# Patient Record
Sex: Male | Born: 1965 | Race: Black or African American | Hispanic: No | Marital: Single
Health system: Southern US, Community
[De-identification: ages and names within clinical notes are randomized; demographics above are authoritative.]

---

## 2020-05-11 ENCOUNTER — Inpatient Hospital Stay (HOSPITAL_COMMUNITY): Payer: Self-pay

## 2020-05-11 ENCOUNTER — Inpatient Hospital Stay (HOSPITAL_COMMUNITY)
Admission: EM | Admit: 2020-05-11 | Discharge: 2020-05-21 | DRG: 296 | Disposition: E | Payer: Self-pay | Attending: Pulmonary Disease | Admitting: Pulmonary Disease

## 2020-05-11 ENCOUNTER — Emergency Department (HOSPITAL_COMMUNITY): Payer: Self-pay

## 2020-05-11 DIAGNOSIS — R000 Tachycardia, unspecified: Secondary | ICD-10-CM | POA: Diagnosis present

## 2020-05-11 DIAGNOSIS — Z515 Encounter for palliative care: Secondary | ICD-10-CM

## 2020-05-11 DIAGNOSIS — E872 Acidosis: Secondary | ICD-10-CM | POA: Diagnosis present

## 2020-05-11 DIAGNOSIS — J9601 Acute respiratory failure with hypoxia: Secondary | ICD-10-CM | POA: Diagnosis present

## 2020-05-11 DIAGNOSIS — G931 Anoxic brain damage, not elsewhere classified: Secondary | ICD-10-CM | POA: Diagnosis present

## 2020-05-11 DIAGNOSIS — G40901 Epilepsy, unspecified, not intractable, with status epilepticus: Secondary | ICD-10-CM | POA: Diagnosis present

## 2020-05-11 DIAGNOSIS — R404 Transient alteration of awareness: Secondary | ICD-10-CM | POA: Diagnosis present

## 2020-05-11 DIAGNOSIS — J690 Pneumonitis due to inhalation of food and vomit: Secondary | ICD-10-CM | POA: Diagnosis present

## 2020-05-11 DIAGNOSIS — Z9911 Dependence on respirator [ventilator] status: Secondary | ICD-10-CM

## 2020-05-11 DIAGNOSIS — Z452 Encounter for adjustment and management of vascular access device: Secondary | ICD-10-CM

## 2020-05-11 DIAGNOSIS — I469 Cardiac arrest, cause unspecified: Secondary | ICD-10-CM

## 2020-05-11 DIAGNOSIS — G40409 Other generalized epilepsy and epileptic syndromes, not intractable, without status epilepticus: Secondary | ICD-10-CM

## 2020-05-11 DIAGNOSIS — Z66 Do not resuscitate: Secondary | ICD-10-CM | POA: Diagnosis not present

## 2020-05-11 DIAGNOSIS — G936 Cerebral edema: Secondary | ICD-10-CM | POA: Diagnosis present

## 2020-05-11 DIAGNOSIS — K7200 Acute and subacute hepatic failure without coma: Secondary | ICD-10-CM | POA: Diagnosis not present

## 2020-05-11 DIAGNOSIS — R7989 Other specified abnormal findings of blood chemistry: Secondary | ICD-10-CM | POA: Diagnosis present

## 2020-05-11 DIAGNOSIS — R4182 Altered mental status, unspecified: Secondary | ICD-10-CM

## 2020-05-11 DIAGNOSIS — Z20822 Contact with and (suspected) exposure to covid-19: Secondary | ICD-10-CM | POA: Diagnosis present

## 2020-05-11 DIAGNOSIS — R008 Other abnormalities of heart beat: Secondary | ICD-10-CM

## 2020-05-11 DIAGNOSIS — E876 Hypokalemia: Secondary | ICD-10-CM | POA: Diagnosis present

## 2020-05-11 DIAGNOSIS — G9341 Metabolic encephalopathy: Secondary | ICD-10-CM | POA: Diagnosis present

## 2020-05-11 LAB — BASIC METABOLIC PANEL
Anion gap: 11 (ref 5–15)
BUN: 9 mg/dL (ref 6–20)
CO2: 24 mmol/L (ref 22–32)
Calcium: 8.3 mg/dL — ABNORMAL LOW (ref 8.9–10.3)
Chloride: 103 mmol/L (ref 98–111)
Creatinine, Ser: 0.85 mg/dL (ref 0.61–1.24)
GFR, Estimated: >60 mL/min (ref >60)
Glucose, Bld: 98 mg/dL (ref 70–99)
Potassium: 3.6 mmol/L (ref 3.5–5.1)
Sodium: 138 mmol/L (ref 135–145)

## 2020-05-11 LAB — I-STAT ARTERIAL BLOOD GAS, ED
Acid-base deficit: 10 mmol/L — ABNORMAL HIGH (ref 0.0–2.0)
Bicarbonate: 17.3 mmol/L — ABNORMAL LOW (ref 20.0–28.0)
Calcium, Ion: 1.14 mmol/L — ABNORMAL LOW (ref 1.15–1.40)
HCT: 48 % (ref 39.0–52.0)
Hemoglobin: 16.3 g/dL (ref 13.0–17.0)
O2 Saturation: 96 %
Potassium: 3.3 mmol/L — ABNORMAL LOW (ref 3.5–5.1)
Sodium: 141 mmol/L (ref 135–145)
TCO2: 19 mmol/L — ABNORMAL LOW (ref 22–32)
pCO2 arterial: 43 mmHg (ref 32.0–48.0)
pH, Arterial: 7.212 — ABNORMAL LOW (ref 7.350–7.450)
pO2, Arterial: 102 mmHg (ref 83.0–108.0)

## 2020-05-11 LAB — ECHOCARDIOGRAM COMPLETE
Area-P 1/2: 7.44 cm2
Calc EF: 41.1 %
Height: 71 in
S' Lateral: 3.9 cm
Single Plane A2C EF: 45.5 %
Single Plane A4C EF: 34.5 %
Weight: 3880.1 oz

## 2020-05-11 LAB — ETHANOL: Alcohol, Ethyl (B): 141 mg/dL — ABNORMAL HIGH (ref <10)

## 2020-05-11 LAB — CBC
HCT: 47.2 % (ref 39.0–52.0)
Hemoglobin: 15.8 g/dL (ref 13.0–17.0)
MCH: 33.4 pg (ref 26.0–34.0)
MCHC: 33.5 g/dL (ref 30.0–36.0)
MCV: 99.8 fL (ref 80.0–100.0)
Platelets: 262 10*3/uL (ref 150–400)
RBC: 4.73 MIL/uL (ref 4.22–5.81)
RDW: 12.9 % (ref 11.5–15.5)
WBC: 13.1 10*3/uL — ABNORMAL HIGH (ref 4.0–10.5)
nRBC: 0 % (ref 0.0–0.2)

## 2020-05-11 LAB — URINALYSIS, ROUTINE W REFLEX MICROSCOPIC
Bilirubin Urine: NEGATIVE
Glucose, UA: >=500 mg/dL — AB
Ketones, ur: 5 mg/dL — AB
Leukocytes,Ua: NEGATIVE
Nitrite: NEGATIVE
Protein, ur: 100 mg/dL — AB
Specific Gravity, Urine: 1.013 (ref 1.005–1.030)
pH: 5 (ref 5.0–8.0)

## 2020-05-11 LAB — CBC WITH DIFFERENTIAL/PLATELET
Abs Immature Granulocytes: 0.4 10*3/uL — ABNORMAL HIGH (ref 0.00–0.07)
Basophils Absolute: 0.1 10*3/uL (ref 0.0–0.1)
Basophils Relative: 1 %
Eosinophils Absolute: 0 10*3/uL (ref 0.0–0.5)
Eosinophils Relative: 0 %
HCT: 47.1 % (ref 39.0–52.0)
Hemoglobin: 15.5 g/dL (ref 13.0–17.0)
Immature Granulocytes: 3 %
Lymphocytes Relative: 25 %
Lymphs Abs: 3 10*3/uL (ref 0.7–4.0)
MCH: 33.4 pg (ref 26.0–34.0)
MCHC: 32.9 g/dL (ref 30.0–36.0)
MCV: 101.5 fL — ABNORMAL HIGH (ref 80.0–100.0)
Monocytes Absolute: 0.7 10*3/uL (ref 0.1–1.0)
Monocytes Relative: 6 %
Neutro Abs: 7.8 10*3/uL — ABNORMAL HIGH (ref 1.7–7.7)
Neutrophils Relative %: 65 %
Platelets: 263 10*3/uL (ref 150–400)
RBC: 4.64 MIL/uL (ref 4.22–5.81)
RDW: 13 % (ref 11.5–15.5)
WBC: 11.9 10*3/uL — ABNORMAL HIGH (ref 4.0–10.5)
nRBC: 0 % (ref 0.0–0.2)

## 2020-05-11 LAB — COMPREHENSIVE METABOLIC PANEL
ALT: 80 U/L — ABNORMAL HIGH (ref 0–44)
AST: 86 U/L — ABNORMAL HIGH (ref 15–41)
Albumin: 3.7 g/dL (ref 3.5–5.0)
Alkaline Phosphatase: 70 U/L (ref 38–126)
Anion gap: 18 — ABNORMAL HIGH (ref 5–15)
BUN: 8 mg/dL (ref 6–20)
CO2: 15 mmol/L — ABNORMAL LOW (ref 22–32)
Calcium: 8 mg/dL — ABNORMAL LOW (ref 8.9–10.3)
Chloride: 105 mmol/L (ref 98–111)
Creatinine, Ser: 1.26 mg/dL — ABNORMAL HIGH (ref 0.61–1.24)
GFR, Estimated: >60 mL/min (ref >60)
Glucose, Bld: 285 mg/dL — ABNORMAL HIGH (ref 70–99)
Potassium: 3.4 mmol/L — ABNORMAL LOW (ref 3.5–5.1)
Sodium: 138 mmol/L (ref 135–145)
Total Bilirubin: 1 mg/dL (ref 0.3–1.2)
Total Protein: 6.6 g/dL (ref 6.5–8.1)

## 2020-05-11 LAB — GLUCOSE, CAPILLARY: Glucose-Capillary: 77 mg/dL (ref 70–99)

## 2020-05-11 LAB — RESP PANEL BY RT-PCR (FLU A&B, COVID) ARPGX2
Influenza A by PCR: NEGATIVE
Influenza B by PCR: NEGATIVE
SARS Coronavirus 2 by RT PCR: NEGATIVE

## 2020-05-11 LAB — RAPID URINE DRUG SCREEN, HOSP PERFORMED
Amphetamines: NOT DETECTED
Barbiturates: NOT DETECTED
Benzodiazepines: POSITIVE — AB
Cocaine: NOT DETECTED
Opiates: NOT DETECTED
Tetrahydrocannabinol: NOT DETECTED

## 2020-05-11 LAB — CREATININE, SERUM
Creatinine, Ser: 1.17 mg/dL (ref 0.61–1.24)
GFR, Estimated: >60 mL/min (ref >60)

## 2020-05-11 LAB — LACTIC ACID, PLASMA: Lactic Acid, Venous: 7.7 mmol/L (ref 0.5–1.9)

## 2020-05-11 LAB — PROTIME-INR
INR: 1.1 (ref 0.8–1.2)
Prothrombin Time: 14.3 seconds (ref 11.4–15.2)

## 2020-05-11 LAB — HEMOGLOBIN A1C
Hgb A1c MFr Bld: 4.6 % — ABNORMAL LOW (ref 4.8–5.6)
Mean Plasma Glucose: 85.32 mg/dL

## 2020-05-11 LAB — ACETAMINOPHEN LEVEL: Acetaminophen (Tylenol), Serum: <10 ug/mL — ABNORMAL LOW (ref 10–30)

## 2020-05-11 LAB — MRSA PCR SCREENING: MRSA by PCR: NEGATIVE

## 2020-05-11 LAB — HIV ANTIBODY (ROUTINE TESTING W REFLEX): HIV Screen 4th Generation wRfx: NONREACTIVE

## 2020-05-11 LAB — TROPONIN I (HIGH SENSITIVITY)
Troponin I (High Sensitivity): 13 ng/L (ref <18)
Troponin I (High Sensitivity): 99 ng/L — ABNORMAL HIGH (ref <18)

## 2020-05-11 LAB — LIPASE, BLOOD: Lipase: 33 U/L (ref 11–51)

## 2020-05-11 MED ORDER — HEPARIN SODIUM (PORCINE) 5000 UNIT/ML IJ SOLN: 5000.0000 [IU] | Freq: Three times a day (TID) | INTRAMUSCULAR | Status: DC

## 2020-05-11 MED ORDER — SODIUM CHLORIDE 0.9 % IV BOLUS: 500.0000 mL | Freq: Once | INTRAVENOUS | Status: DC

## 2020-05-11 MED ORDER — POLYETHYLENE GLYCOL 3350 17 G PO PACK: 17.0000 g | PACK | Freq: Every day | ORAL | Status: DC | PRN

## 2020-05-11 MED ORDER — LEVETIRACETAM IN NACL 1500 MG/100ML IV SOLN
1500.0000 mg | Freq: Two times a day (BID) | INTRAVENOUS | Status: DC
  Filled 2020-05-11: qty 100

## 2020-05-11 MED ORDER — POTASSIUM CHLORIDE 20 MEQ PO PACK
20.0000 meq | PACK | Freq: Once | ORAL | Status: AC
  Administered 2020-05-11: 20 meq
  Filled 2020-05-11: qty 1

## 2020-05-11 MED ORDER — CISATRACURIUM BESYLATE (PF) 200 MG/20ML IV SOLN
0.0000 ug/kg/min | INTRAVENOUS | Status: DC
  Administered 2020-05-11: 3 ug/kg/min via INTRAVENOUS
  Administered 2020-05-12: 2 ug/kg/min via INTRAVENOUS
  Filled 2020-05-11 (×3): qty 20

## 2020-05-11 MED ORDER — CEFTRIAXONE SODIUM 2 G IJ SOLR
2.0000 g | INTRAMUSCULAR | Status: DC
Start: 2020-05-11 — End: 2020-05-15
  Administered 2020-05-11 – 2020-05-14 (×4): 2 g via INTRAVENOUS
  Filled 2020-05-11 (×2): qty 20
  Filled 2020-05-11 (×2): qty 2
  Filled 2020-05-11 (×2): qty 20

## 2020-05-11 MED ORDER — INSULIN ASPART 100 UNIT/ML ~~LOC~~ SOLN
0.0000 [IU] | SUBCUTANEOUS | Status: DC
  Administered 2020-05-15: 2 [IU] via SUBCUTANEOUS

## 2020-05-11 MED ORDER — SODIUM CHLORIDE 0.9 % IV SOLN
250.0000 mL | INTRAVENOUS | Status: DC
  Administered 2020-05-11 – 2020-05-12 (×2): 250 mL via INTRAVENOUS

## 2020-05-11 MED ORDER — FENTANYL CITRATE (PF) 100 MCG/2ML IJ SOLN: 50.0000 ug | INTRAMUSCULAR | Status: DC | PRN

## 2020-05-11 MED ORDER — FENTANYL CITRATE (PF) 100 MCG/2ML IJ SOLN
50.0000 ug | INTRAMUSCULAR | Status: DC | PRN
  Administered 2020-05-11: 50 ug via INTRAVENOUS

## 2020-05-11 MED ORDER — SODIUM CHLORIDE 0.9 % IV SOLN
4000.0000 mg | INTRAVENOUS | Status: AC
  Administered 2020-05-11: 4000 mg via INTRAVENOUS
  Filled 2020-05-11: qty 40

## 2020-05-11 MED ORDER — MIDAZOLAM 50MG/50ML (1MG/ML) PREMIX INFUSION
0.5000 mg/h | INTRAVENOUS | Status: DC
  Administered 2020-05-11: 0.5 mg/h via INTRAVENOUS
  Filled 2020-05-11: qty 50

## 2020-05-11 MED ORDER — CHLORHEXIDINE GLUCONATE CLOTH 2 % EX PADS
6.0000 | MEDICATED_PAD | Freq: Every day | CUTANEOUS | Status: DC
  Administered 2020-05-12 – 2020-05-15 (×4): 6 via TOPICAL

## 2020-05-11 MED ORDER — LEVETIRACETAM IN NACL 1000 MG/100ML IV SOLN
1000.0000 mg | Freq: Two times a day (BID) | INTRAVENOUS | Status: DC
  Administered 2020-05-12 – 2020-05-15 (×8): 1000 mg via INTRAVENOUS
  Filled 2020-05-11 (×8): qty 100

## 2020-05-11 MED ORDER — PROPOFOL 1000 MG/100ML IV EMUL
0.0000 ug/kg/min | INTRAVENOUS | Status: DC
  Administered 2020-05-11: 50 ug/kg/min via INTRAVENOUS
  Administered 2020-05-11: 25 ug/kg/min via INTRAVENOUS
  Administered 2020-05-12: 20 ug/kg/min via INTRAVENOUS
  Administered 2020-05-12 (×3): 30 ug/kg/min via INTRAVENOUS
  Administered 2020-05-13 – 2020-05-14 (×7): 25 ug/kg/min via INTRAVENOUS
  Administered 2020-05-14: 5 ug/kg/min via INTRAVENOUS
  Administered 2020-05-14: 25 ug/kg/min via INTRAVENOUS
  Filled 2020-05-11 (×2): qty 100
  Filled 2020-05-11: qty 200
  Filled 2020-05-11 (×6): qty 100
  Filled 2020-05-11: qty 200
  Filled 2020-05-11 (×4): qty 100

## 2020-05-11 MED ORDER — MIDAZOLAM 50MG/50ML (1MG/ML) PREMIX INFUSION
0.5000 mg/h | INTRAVENOUS | Status: AC
  Administered 2020-05-11: 5 mg/h via INTRAVENOUS
  Filled 2020-05-11: qty 50

## 2020-05-11 MED ORDER — ROCURONIUM BROMIDE 10 MG/ML (PF) SYRINGE
PREFILLED_SYRINGE | INTRAVENOUS | Status: AC
  Filled 2020-05-11: qty 10

## 2020-05-11 MED ORDER — DOCUSATE SODIUM 50 MG/5ML PO LIQD: 100.0000 mg | Freq: Two times a day (BID) | ORAL | Status: DC | PRN

## 2020-05-11 MED ORDER — NOREPINEPHRINE 4 MG/250ML-% IV SOLN
INTRAVENOUS | Status: AC
  Filled 2020-05-11: qty 250

## 2020-05-11 MED ORDER — HEPARIN SODIUM (PORCINE) 5000 UNIT/ML IJ SOLN
5000.0000 [IU] | Freq: Three times a day (TID) | INTRAMUSCULAR | Status: DC
  Administered 2020-05-11 – 2020-05-15 (×12): 5000 [IU] via SUBCUTANEOUS
  Filled 2020-05-11 (×12): qty 1

## 2020-05-11 MED ORDER — ROCURONIUM BROMIDE 50 MG/5ML IV SOLN
80.0000 mg | Freq: Once | INTRAVENOUS | Status: AC
  Administered 2020-05-11: 80 mg via INTRAVENOUS
  Filled 2020-05-11: qty 8

## 2020-05-11 MED ORDER — FENTANYL 2500MCG IN NS 250ML (10MCG/ML) PREMIX INFUSION
50.0000 ug/h | INTRAVENOUS | Status: DC
  Administered 2020-05-11: 50 ug/h via INTRAVENOUS
  Administered 2020-05-12: 200 ug/h via INTRAVENOUS
  Administered 2020-05-13: 150 ug/h via INTRAVENOUS
  Administered 2020-05-14: 100 ug/h via INTRAVENOUS
  Filled 2020-05-11 (×5): qty 250

## 2020-05-11 MED ORDER — FENTANYL BOLUS VIA INFUSION
50.0000 ug | INTRAVENOUS | Status: DC | PRN
  Filled 2020-05-11: qty 50

## 2020-05-11 MED ORDER — CISATRACURIUM BOLUS VIA INFUSION
10.0000 mg | Freq: Once | INTRAVENOUS | Status: AC
  Administered 2020-05-11: 10 mg via INTRAVENOUS
  Filled 2020-05-11: qty 10

## 2020-05-11 MED ORDER — FENTANYL CITRATE (PF) 100 MCG/2ML IJ SOLN
50.0000 ug | Freq: Once | INTRAMUSCULAR | Status: AC
  Administered 2020-05-11: 50 ug via INTRAVENOUS

## 2020-05-11 MED ORDER — STERILE WATER FOR INJECTION IV SOLN
INTRAVENOUS | Status: DC
  Filled 2020-05-11 (×4): qty 1000

## 2020-05-11 MED ORDER — SODIUM CHLORIDE 0.9 % IV SOLN
0.5000 mg/h | INTRAVENOUS | Status: DC
  Administered 2020-05-11 – 2020-05-12 (×2): 20 mg/h via INTRAVENOUS
  Administered 2020-05-12: 60 mg/h via INTRAVENOUS
  Administered 2020-05-12: 80 mg/h via INTRAVENOUS
  Administered 2020-05-13: 55 mg/h via INTRAVENOUS
  Administered 2020-05-13: 45 mg/h via INTRAVENOUS
  Administered 2020-05-13: 30 mg/h via INTRAVENOUS
  Administered 2020-05-14: 5 mg/h via INTRAVENOUS
  Filled 2020-05-11 (×11): qty 50

## 2020-05-11 MED ORDER — NOREPINEPHRINE 4 MG/250ML-% IV SOLN
2.0000 ug/min | INTRAVENOUS | Status: DC
  Administered 2020-05-12: 2 ug/min via INTRAVENOUS
  Filled 2020-05-11: qty 250

## 2020-05-11 MED ORDER — SODIUM CHLORIDE 0.9 % IV SOLN: INTRAVENOUS | Status: DC

## 2020-05-11 MED ORDER — PANTOPRAZOLE SODIUM 40 MG PO PACK
40.0000 mg | PACK | Freq: Every day | ORAL | Status: DC
  Administered 2020-05-11 – 2020-05-15 (×5): 40 mg
  Filled 2020-05-11 (×5): qty 20

## 2020-05-11 MED ORDER — LABETALOL HCL 5 MG/ML IV SOLN: 10.0000 mg | INTRAVENOUS | Status: DC | PRN

## 2020-05-11 MED ORDER — ORAL CARE MOUTH RINSE
15.0000 mL | OROMUCOSAL | Status: DC
  Administered 2020-05-11 – 2020-05-15 (×36): 15 mL via OROMUCOSAL

## 2020-05-11 MED ORDER — DOCUSATE SODIUM 100 MG PO CAPS: 100.0000 mg | ORAL_CAPSULE | Freq: Two times a day (BID) | ORAL | Status: DC | PRN

## 2020-05-11 MED ORDER — ARTIFICIAL TEARS OPHTHALMIC OINT
1.0000 "application " | TOPICAL_OINTMENT | Freq: Three times a day (TID) | OPHTHALMIC | Status: DC
  Administered 2020-05-11 – 2020-05-15 (×12): 1 via OPHTHALMIC
  Filled 2020-05-11 (×2): qty 3.5

## 2020-05-11 MED ORDER — PROPOFOL 1000 MG/100ML IV EMUL
INTRAVENOUS | Status: AC
  Administered 2020-05-11: 5 ug/kg/min via INTRAVENOUS
  Filled 2020-05-11: qty 100

## 2020-05-11 MED ORDER — CHLORHEXIDINE GLUCONATE 0.12% ORAL RINSE (MEDLINE KIT)
15.0000 mL | Freq: Two times a day (BID) | OROMUCOSAL | Status: DC
  Administered 2020-05-11 – 2020-05-15 (×9): 15 mL via OROMUCOSAL

## 2020-05-11 MED ORDER — SODIUM CHLORIDE 0.9 % IV SOLN
2.0000 g | INTRAVENOUS | Status: DC
  Filled 2020-05-11: qty 20

## 2020-05-11 NOTE — ED Triage Notes (Signed)
Found unresponsive in hotel room, pulseless and apneic. CPR initiated, given 1 epi. Regained ROSC. EKG unremarkable. Given 2mg  narcan with improvement in respiratory status. 8.0 ETT. 16g LFA. CPR initiated 0650, ROSC at 0700. Given 2.5mg  midazolam en route for agitation.

## 2020-05-11 NOTE — ED Notes (Addendum)
Pt's boxers, red shoes, chapstick, pants, ID, keys, black jacket, headphones, credit cards x2 placed in patient belonging bag.

## 2020-05-11 NOTE — Progress Notes (Signed)
LTM EEG hooked up and running - no initial skin breakdown - push button tested - neuro notified.  

## 2020-05-11 NOTE — H&P (Signed)
NAME:  Frank Payne, MRN:  [id number], DOB:  Jul 11, 1965, LOS: 0 ADMISSION DATE:  May 21, 2020, CONSULTATION DATE:  2020/05/21 REFERRING MD:  Deretha Emory, CHIEF COMPLAINT:  Cardiac arrest   History of Present Illness:  55 year old man with unk PMH presenting with cardiac arrest. OOH, unwitnessed, unk downtime, PEA initial rhythm CPR x 10 mins before ROSC Given sedation en route for question agitation Question if narcan helped Now in myoclonus Labs are pending CT head is pending PCCM asked to admit  Pertinent  Medical History  Unknown  Significant Hospital Events: Including procedures, antibiotic start and stop dates in addition to other pertinent events   . 4/21 admitted  Interim History / Subjective:  Consulted  Objective   Blood pressure 111/70, pulse (!) 110, resp. rate (!) 22, height 5\' 11"  (1.803 m), SpO2 100 %.    Vent Mode: PRVC FiO2 (%):  [40 %] 40 % Set Rate:  [18 bmp] 18 bmp Vt Set:  [600 mL] 600 mL PEEP:  [5 cmH20] 5 cmH20 Plateau Pressure:  [14 cmH20] 14 cmH20  No intake or output data in the 24 hours ending May 21, 2020 0912 There were no vitals filed for this visit.  Examination: Constitutional: ill appearing comatose man in myoclonus  Eyes: pupils not reacting, twitching of eyelids does not allow good corneal reflex examt Ears, nose, mouth, and throat: ETT in place, minimal secretions Cardiovascular: tachycardic, ext warm Respiratory: scattered rhonci, triggering vent Gastrointestinal: soft, hypoactive bS Skin: No rashes, normal turgor Neurologic: myoclonus worse on left Psychiatric: cannot assess    Labs/imaging that I havepersonally reviewed  (right click and "Reselect all SmartList Selections" daily)   ABG mixed resp/metabolic acidosis CBC mild leukocytosis EKG no STEMI Head CT pending CXR L aspiration likely UDS pending  Resolved Hospital Problem list   n/a  Assessment & Plan:  OOH PEA Arrest suspect prolonged downtime.  EKG benign.  Would  like to see UDS. Anoxic myoclonus and encephalopathy Acute hypoxemic respiratory failure with likely aspiration pneumonitis  - CT Head, if no bleed proceed with TTM 36C, continuous EEG etc - f/u CMP, UDS - Check lactate/trop - Prop/versed to help with myoclonus - Ceftriaxone x 5 days for aspiration, vent support with VAP prevention bundle - Need to find family, do not think this is going to go well  Best practice (right click and "Reselect all SmartList Selections" daily)  Diet:  NPO Pain/Anxiety/Delirium protocol (if indicated): Yes (RASS goal -4) VAP protocol (if indicated): Yes DVT prophylaxis: SCD GI prophylaxis: PPI Glucose control:  SSI Yes Central venous access:  Yes, and it is still needed Arterial line:  N/A Foley:  Yes, and it is still needed Mobility:  bed rest  PT consulted: N/A Last date of multidisciplinary goals of care discussion [pending] Code Status:  full code Disposition: ICU  Labs   CBC: Recent Labs  Lab 21-May-2020 0825 05/21/2020 0848  WBC 11.9*  --   NEUTROABS 7.8*  --   HGB 15.5 16.3  HCT 47.1 48.0  MCV 101.5*  --   PLT 263  --     Basic Metabolic Panel: Recent Labs  Lab May 21, 2020 0848  NA 141  K 3.3*   GFR: CrCl cannot be calculated (No successful lab value found.). Recent Labs  Lab May 21, 2020 0825  WBC 11.9*    Liver Function Tests: No results for input(s): AST, ALT, ALKPHOS, BILITOT, PROT, ALBUMIN in the last 168 hours. No results for input(s): LIPASE, AMYLASE in the last 168 hours.  No results for input(s): AMMONIA in the last 168 hours.  ABG    Component Value Date/Time   PHART 7.212 (L) 06-04-20 0848   PCO2ART 43.0 06-04-20 0848   PO2ART 102 06-04-2020 0848   HCO3 17.3 (L) 2020-06-04 0848   TCO2 19 (L) 2020/06/04 0848   ACIDBASEDEF 10.0 (H) 2020/06/04 0848   O2SAT 96.0 06/04/20 0848     Coagulation Profile: No results for input(s): INR, PROTIME in the last 168 hours.  Cardiac Enzymes: No results for input(s):  CKTOTAL, CKMB, CKMBINDEX, TROPONINI in the last 168 hours.  HbA1C: No results found for: HGBA1C  CBG: No results for input(s): GLUCAP in the last 168 hours.  Review of Systems:   comatose  Past Medical History:  Unk  Surgical History:  Unk  Social History:  Unk  Family History:  Unk  Allergies Not on File   Home Medications  Prior to Admission medications   Not on File     Critical care time: 42 minutes not including any separately billable procedures

## 2020-05-11 NOTE — ED Provider Notes (Signed)
[facility] EMERGENCY DEPARTMENT Provider Note   CSN: [id number] Arrival date & time: 06/05/20  8891     History Chief Complaint  Patient presents with  . Cardiac Arrest    Frank Payne is a 55 y.o. male.  EMS arrived at about [phone] for patient unresponsive hotel room.  There was a woman in the room but said she did not know any of the circumstances.  Patient was pulseless apneic did not get specific rhythm from EMS.  They started CPR.  Patient received 1 mg of epinephrine and received 2 mg of Narcan.  Started breathing on his own.  CPR lasted at most 10 minutes more likely 6 minutes.  Patient was given some Versed because he was chewing on the endotracheal tube.  Paramedic did intubate him with an 8-0 tube.  Patient then remained with normal vital signs.  Slightly tachycardic blood pressures were normal.  Patient had an IO in his left shoulder and an IV in his left hand.  Long transport time.  Patient arrived at around 0 800.        No past medical history on file.  There are no problems to display for this patient.      No family history on file.     Home Medications Prior to Admission medications   Not on File    Allergies    Patient has no allergy information on record.  Review of Systems   Review of Systems  Unable to perform ROS: Patient unresponsive    Physical Exam Updated Vital Signs BP 111/70   Pulse (!) 110   Resp (!) 22   Ht 1.803 m (5\' 11" )   SpO2 100%   Physical Exam Vitals and nursing note reviewed.  Constitutional:      General: He is in acute distress.     Appearance: He is well-developed.     Comments: Patient arrived intubated.  HENT:     Head: Normocephalic and atraumatic.     Mouth/Throat:     Comments: 8 oh endotracheal tube through oropharynx Eyes:     Conjunctiva/sclera: Conjunctivae normal.     Comments: Pupils pinpoint.  Corneal reflex present  Cardiovascular:     Rate and Rhythm: Regular rhythm.  Tachycardia present.     Heart sounds: No murmur heard.   Pulmonary:     Effort: Respiratory distress present.     Breath sounds: Normal breath sounds. No wheezing or rales.     Comments: Patient breathing on his own. Abdominal:     Palpations: Abdomen is soft.     Tenderness: There is no abdominal tenderness.  Genitourinary:    Comments: Condom on penis Musculoskeletal:        General: No swelling.     Cervical back: Neck supple.     Comments: Radial and dorsalis pedis pulse strong 2+.  Skin:    General: Skin is warm and dry.     Capillary Refill: Capillary refill takes less than 2 seconds.  Neurological:     General: No focal deficit present.     Mental Status: He is oriented to person, place, and time.     Cranial Nerves: No cranial nerve deficit.     Sensory: No sensory deficit.     Motor: No weakness.     Comments: Patient breathing on his own.  Nonpurposeful movement of the left arm.  Corneal reflex present.     ED Results / Procedures / Treatments  Labs (all labs ordered are listed, but only abnormal results are displayed) Labs Reviewed  CBC WITH DIFFERENTIAL/PLATELET - Abnormal; Notable for the following components:      Result Value   WBC 11.9 (*)    MCV 101.5 (*)    Neutro Abs 7.8 (*)    Abs Immature Granulocytes 0.40 (*)    All other components within normal limits  I-STAT ARTERIAL BLOOD GAS, ED - Abnormal; Notable for the following components:   pH, Arterial 7.212 (*)    Bicarbonate 17.3 (*)    TCO2 19 (*)    Acid-base deficit 10.0 (*)    Potassium 3.3 (*)    Calcium, Ion 1.14 (*)    All other components within normal limits  RESP PANEL BY RT-PCR (FLU A&B, COVID) ARPGX2  URINALYSIS, ROUTINE W REFLEX MICROSCOPIC  ETHANOL  RAPID URINE DRUG SCREEN, HOSP PERFORMED  COMPREHENSIVE METABOLIC PANEL  LIPASE, BLOOD  ACETAMINOPHEN LEVEL  TROPONIN I (HIGH SENSITIVITY)    EKG EKG Interpretation  Date/Time:  Thursday May 11 2020 08:14:09 EDT Ventricular  Rate:  114 PR Interval:  157 QRS Duration: 88 QT Interval:  314 QTC Calculation: 433 R Axis:   23 Text Interpretation: Sinus tachycardia Ventricular premature complex Aberrant complex Confirmed by Vanetta Mulders [id number]) on 05/08/2020 8:28:25 AM   Radiology DG Chest Port 1 View  Result Date: 05/10/2020 CLINICAL DATA:  Status post cardiac arrest. Intubated. EXAM: PORTABLE CHEST 1 VIEW COMPARISON:  Thirteen 2021 FINDINGS: Endotracheal tube in satisfactory position. Stable enlarged cardiac silhouette. Interval mild patchy opacity in the left lung, most pronounced in the perihilar region. Clear right lung. Normal vascularity. Unremarkable bones. IMPRESSION: 1. Interval mild left lung patchy atelectasis, pneumonia or pulmonary contusion. 2. Stable cardiomegaly. 3. Endotracheal tube in satisfactory position. Electronically Signed   By: Beckie Salts M.D.   On: 05/20/2020 08:44    Procedures Procedures   CRITICAL CARE Performed by: Vanetta Mulders Total critical care time: 35 minutes Critical care time was exclusive of separately billable procedures and treating other patients. Critical care was necessary to treat or prevent imminent or life-threatening deterioration. Critical care was time spent personally by me on the following activities: development of treatment plan with patient and/or surrogate as well as nursing, discussions with consultants, evaluation of patient's response to treatment, examination of patient, obtaining history from patient or surrogate, ordering and performing treatments and interventions, ordering and review of laboratory studies, ordering and review of radiographic studies, pulse oximetry and re-evaluation of patient's condition.   Medications Ordered in ED Medications  midazolam (VERSED) 50 mg/50 mL (1 mg/mL) premix infusion (1.5 mg/hr Intravenous Rate/Dose Change 05/09/2020 0841)  0.9 %  sodium chloride infusion ( Intravenous New Bag/Given 04/23/2020 1062)    ED  Course  I have reviewed the triage vital signs and the nursing notes.  Pertinent labs & imaging results that were available during my care of the patient were reviewed by me and considered in my medical decision making (see chart for details).    MDM Rules/Calculators/A&P                          Patient upon arrival was intubated.  Breathing on his own.  Heart rate in the low 100s.  Blood pressure systolic 100 or better.  Patient had corneal reflex.  Patient went on to have some nonpurposeful movement of the left upper extremity.  Good breath sounds bilaterally patient was put on ventilator.  Chest x-ray  shows good endotracheal tube positioning.  Raises concerns for left lung patchy atelectasis pneumonia or pulmonary contusion.  Patient did have CPR.  Blood gas showed significant acidosis.  But currently PCO2 43 and PO2 102.  Suggestive of perhaps a long downtime.  Mild leukocytosis with a white blood cell count of 11.9.  Hemoglobin was 15.5.  Electrolytes are still pending.  Head CT has been ordered.  COVID testing ordered  Discussed with critical care.  They will see patient for admission.  SPECT a long downtime.  May be with some anoxic brain injury.  Patient was started on a Versed drip by [place].  Because he was clamping down on the endotracheal tube.    Final Clinical Impression(s) / ED Diagnoses Final diagnoses:  Cardiopulmonary arrest [facility])    Rx / DC Orders ED Discharge Orders    None       Vanetta Mulders, MD May 24, 2020 [phone]

## 2020-05-11 NOTE — Progress Notes (Addendum)
NOK called over camera phone. Discussed current anoxic status epilepticus, circumstances surrounding arrest. We agreed to continue current level of care but if Frank Payne takes a turn we will allow him to pass in peace. Should he not wake up despite TTM and seizure suppression in a few days, will revisit GOC.  18 minutes advance care discussions.  Myrla Halsted MD PCCM

## 2020-05-11 NOTE — Progress Notes (Signed)
Was able to get in contact with patients niece Rocco Serene.  She has been updated on patients condition and questions answered.  She will be attempting to contact patients family.

## 2020-05-11 NOTE — Consult Note (Addendum)
Neurology Consultation Reason for Consult: Myoclonic seizures Referring Physician: Dr. Levon Hedger  CC: Cardiac arrest  History is obtained from: Chart review as patient is intubated/sedated  HPI: Frank Payne is a 55 y.o. male who was brought in after an out-of-hospital cardiac arrest with unknown downtime.  CPR was performed for about 10 minutes before ROSC.  Was then noted to have axial jerking.  Stat EEG showed myoclonic seizures.  Therefore neurology was consulted.  ROS: Unable to obtain due to altered mental status.   Past medical history, surgical history, family history, social history: Unable to obtain due to altered mental status  Medications Prior to Admission  Medication Sig Dispense Refill Last Dose  . penicillin v potassium (VEETID) 500 MG tablet Take 500 mg by mouth 3 (three) times daily.         Exam: Current vital signs: BP 106/77   Pulse (!) 109   Temp (!) 97.34 F (36.3 C)   Resp (!) 9   Ht 5\' 11"  (1.803 m)   Wt 110 kg   SpO2 97%   BMI 33.82 kg/m  Vital signs in last 24 hours: Temp:  [89.6 F (32 C)-97.34 F (36.3 C)] 97.34 F (36.3 C) (04/21 1245) Pulse Rate:  [101-113] 109 (04/21 1245) Resp:  [9-26] 9 (04/21 1245) BP: (106-127)/(69-80) 106/77 (04/21 1245) SpO2:  [96 %-100 %] 97 % (04/21 1245) FiO2 (%):  [40 %] 40 % (04/21 1100) Weight:  [110 kg] 110 kg (04/21 0917)   Physical Exam  Constitutional: Appears well-developed and well-nourished.  Psych: Unable to assess due to altered mental status Eyes: No scleral injection HENT: No OP obstrucion Head: Normocephalic.  Cardiovascular: Tachycardic, regular rhythm Respiratory: Intubated, coarse breath sounds bilaterally GI: Soft.  No distension.  Skin: Warm Neuro: Comatose, does not open eyes to noxious stimuli, corneal reflex absent, gag reflex absent, decerebrate posturing in bilateral upper extremities, withdrawal in bilateral lower extremities noxious stimulation, had intermittent myoclonic  jerks  I have reviewed labs in epic and the results pertinent to this consultation are: CBC:  Recent Labs  Lab 05/02/2020 0825 05/17/2020 0848 04/28/2020 1135  WBC 11.9*  --  13.1*  NEUTROABS 7.8*  --   --   HGB 15.5 16.3 15.8  HCT 47.1 48.0 47.2  MCV 101.5*  --  99.8  PLT 263  --  262    Basic Metabolic Panel:  Lab Results  Component Value Date   NA 141 04/30/2020   K 3.3 (L) 05/03/2020   CO2 15 (L) 05/02/2020   GLUCOSE 285 (H) 05/09/2020   BUN 8 04/29/2020   CREATININE 1.17 04/21/2020   CALCIUM 8.0 (L) 05/01/2020   GFRNONAA >60 05/04/2020   Lipid Panel: No results found for: LDLCALC HgbA1c: No results found for: HGBA1C Urine Drug Screen:     Component Value Date/Time   LABOPIA NONE DETECTED 04/25/2020 1019   COCAINSCRNUR NONE DETECTED 05/14/2020 1019   LABBENZ POSITIVE (A) 05/10/2020 1019   AMPHETMU NONE DETECTED 04/24/2020 1019   THCU NONE DETECTED 05/17/2020 1019   LABBARB NONE DETECTED 05/05/2020 1019    Alcohol Level     Component Value Date/Time   ETH 141 (H) 05/17/2020 0825     I have reviewed the images obtained:  CT Head without contrast 05/08/2020: No acute abnormality  ASSESSMENT/PLAN: 55 year old male status post cardiac arrest, noted to have myoclonic seizures on arrival.  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  Family made him DNR and would like to continue with current treatment.  However if patient remains comatose, they would likely consider transition to comfort care.  Thank you for allowing 05/16/2020 to participate in the care of this patient. If you have any further questions, please contact  me or neurohospitalist.    CRITICAL CARE Performed by: [place]   Total critical care time: 35 minutes  Critical care time was exclusive of separately billable procedures and treating other patients.  Critical care was necessary to treat or prevent imminent or life-threatening deterioration.  Critical care was time spent personally by me on the following activities: development of treatment plan with patient and/or surrogate as well as nursing, discussions with consultants, evaluation of patient's response to treatment, examination of patient, obtaining history from patient or surrogate, ordering and performing treatments and interventions, ordering and review of laboratory studies, ordering and review of radiographic studies, pulse oximetry and re-evaluation of patient's condition.   Charlsie Quest [facility] neurohospitalist

## 2020-05-11 NOTE — Progress Notes (Signed)
  Echocardiogram 2D Echocardiogram has been performed.  Janalyn Harder May 13, 2020, 3:23 PM

## 2020-05-11 NOTE — Procedures (Signed)
Patient Name: Frank Payne  MRN: [id number]  Epilepsy Attending: Charlsie Quest  Referring Physician/Provider: Raymon Mutton, NP Date: 05/25/20  Duration: 26.50 mins  Patient history: 55 year old male status post cardiac arrest.  EEG to evaluate for seizures.  Level of alertness: comatose  AEDs during EEG study: Propofol, Versed  Technical aspects: This EEG study was done with scalp electrodes positioned according to the 10-20 International system of electrode placement. Electrical activity was acquired at a sampling rate of 500Hz  and reviewed with a high frequency filter of 70Hz  and a low frequency filter of 1Hz . EEG data were recorded continuously and digitally stored.   Description: Patient was noted to have brief generalized whole body jerking every 4 to 8 seconds on average. Concomitant EEG showed generalized polyspikes consistent with myoclonic seizures.  EEG also showed continuous generalized background suppression in between seizures.  EEG was not reactive to tactile stimulation. Hyperventilation and photic stimulation were not performed.     ABNORMALITY -Myoclonic seizures, generalized -Background suppression, generalized  IMPRESSION: This study showed myoclonic seizures every 4 to 8 seconds as well as profound diffuse encephalopathy.  In the setting of cardiac arrest, this is likely suggestive of anoxic/hypoxic brain injury.  Dr. was notified.  Moises Terpstra 

## 2020-05-11 NOTE — Progress Notes (Signed)
EEG complete - results pending 

## 2020-05-11 NOTE — Procedures (Signed)
.  Central Venous Catheter Insertion Procedure Note  Frank Payne  [id number]  09-30-1965  Date:04/25/2020  Time:12:39 PM   Provider Performing:Francyne Arreaga Carmon Ginsberg Earlene Plater   Procedure: Insertion of Non-tunneled Central Venous Catheter(36556) with US guidance (02542)     Indication(s) Medication administration and Difficult access  Consent Unable to obtain consent due to emergent nature of procedure.  Anesthesia Topical only with 1% lidocaine   Timeout Verified patient identification, verified procedure, site/side was marked, verified correct patient position, special equipment/implants available, medications/allergies/relevant history reviewed, required imaging and test results available.  Sterile Technique Maximal sterile technique including full sterile barrier drape, hand hygiene, sterile gown, sterile gloves, mask, hair covering, sterile ultrasound probe cover (if used).  Procedure Description Area of catheter insertion was cleaned with chlorhexidine and draped in sterile fashion.  With real-time ultrasound guidance a central venous catheter was placed into the left internal jugular vein. Nonpulsatile blood flow and easy flushing noted in all ports.  The catheter was sutured in place and sterile dressing applied.  Complications/Tolerance None; patient tolerated the procedure well. Chest X-ray is ordered to verify placement for internal jugular or subclavian cannulation.   Chest x-ray is not ordered for femoral cannulation.  EBL Minimal  Specimen(s) None   Delfin Gant, NP-C [facility] Personal contact information can be found on Amion  05/06/2020, 12:40 PM

## 2020-05-11 NOTE — ED Notes (Signed)
I was unable to get pt to sign MSE due to being unconscious and no family was present.

## 2020-05-12 LAB — TRIGLYCERIDES: Triglycerides: 220 mg/dL — ABNORMAL HIGH (ref <150)

## 2020-05-12 LAB — GLUCOSE, CAPILLARY
Glucose-Capillary: 57 mg/dL — ABNORMAL LOW (ref 70–99)
Glucose-Capillary: 69 mg/dL — ABNORMAL LOW (ref 70–99)
Glucose-Capillary: 78 mg/dL (ref 70–99)
Glucose-Capillary: 78 mg/dL (ref 70–99)
Glucose-Capillary: 79 mg/dL (ref 70–99)
Glucose-Capillary: 80 mg/dL (ref 70–99)
Glucose-Capillary: 84 mg/dL (ref 70–99)
Glucose-Capillary: 91 mg/dL (ref 70–99)
Glucose-Capillary: 93 mg/dL (ref 70–99)
Glucose-Capillary: 96 mg/dL (ref 70–99)

## 2020-05-12 LAB — PHOSPHORUS
Phosphorus: 1.1 mg/dL — ABNORMAL LOW (ref 2.5–4.6)
Phosphorus: 2.4 mg/dL — ABNORMAL LOW (ref 2.5–4.6)

## 2020-05-12 LAB — BASIC METABOLIC PANEL
Anion gap: 9 (ref 5–15)
Anion gap: 11 (ref 5–15)
BUN: 6 mg/dL (ref 6–20)
BUN: 9 mg/dL (ref 6–20)
CO2: 25 mmol/L (ref 22–32)
CO2: 24 mmol/L (ref 22–32)
Calcium: 8 mg/dL — ABNORMAL LOW (ref 8.9–10.3)
Calcium: 7.9 mg/dL — ABNORMAL LOW (ref 8.9–10.3)
Chloride: 107 mmol/L (ref 98–111)
Chloride: 105 mmol/L (ref 98–111)
Creatinine, Ser: 0.95 mg/dL (ref 0.61–1.24)
Creatinine, Ser: 1.35 mg/dL — ABNORMAL HIGH (ref 0.61–1.24)
GFR, Estimated: >60 mL/min (ref >60)
GFR, Estimated: >60 mL/min (ref >60)
Glucose, Bld: 88 mg/dL (ref 70–99)
Glucose, Bld: 92 mg/dL (ref 70–99)
Potassium: 2.9 mmol/L — ABNORMAL LOW (ref 3.5–5.1)
Potassium: 3.3 mmol/L — ABNORMAL LOW (ref 3.5–5.1)
Sodium: 141 mmol/L (ref 135–145)
Sodium: 140 mmol/L (ref 135–145)

## 2020-05-12 LAB — CBC
HCT: 39.5 % (ref 39.0–52.0)
Hemoglobin: 13.9 g/dL (ref 13.0–17.0)
MCH: 33.4 pg (ref 26.0–34.0)
MCHC: 35.2 g/dL (ref 30.0–36.0)
MCV: 95 fL (ref 80.0–100.0)
Platelets: 184 10*3/uL (ref 150–400)
RBC: 4.16 MIL/uL — ABNORMAL LOW (ref 4.22–5.81)
RDW: 12.7 % (ref 11.5–15.5)
WBC: 8.7 10*3/uL (ref 4.0–10.5)
nRBC: 0 % (ref 0.0–0.2)

## 2020-05-12 LAB — HEPATIC FUNCTION PANEL
ALT: 56 U/L — ABNORMAL HIGH (ref 0–44)
AST: 45 U/L — ABNORMAL HIGH (ref 15–41)
Albumin: 3.1 g/dL — ABNORMAL LOW (ref 3.5–5.0)
Alkaline Phosphatase: 57 U/L (ref 38–126)
Bilirubin, Direct: 0.3 mg/dL — ABNORMAL HIGH (ref 0.0–0.2)
Indirect Bilirubin: 1.7 mg/dL — ABNORMAL HIGH (ref 0.3–0.9)
Total Bilirubin: 2 mg/dL — ABNORMAL HIGH (ref 0.3–1.2)
Total Protein: 5.6 g/dL — ABNORMAL LOW (ref 6.5–8.1)

## 2020-05-12 LAB — VALPROIC ACID LEVEL: Valproic Acid Lvl: 91 ug/mL (ref 50.0–100.0)

## 2020-05-12 LAB — MAGNESIUM
Magnesium: 1.3 mg/dL — ABNORMAL LOW (ref 1.7–2.4)
Magnesium: 2.2 mg/dL (ref 1.7–2.4)

## 2020-05-12 MED ORDER — IBUPROFEN 100 MG/5ML PO SUSP
400.0000 mg | Freq: Once | ORAL | Status: AC
  Administered 2020-05-12: 400 mg
  Filled 2020-05-12: qty 20

## 2020-05-12 MED ORDER — POTASSIUM CHLORIDE 20 MEQ PO PACK
40.0000 meq | PACK | Freq: Once | ORAL | Status: AC
  Administered 2020-05-12: 40 meq
  Filled 2020-05-12: qty 2

## 2020-05-12 MED ORDER — DEXTROSE 50 % IV SOLN
INTRAVENOUS | Status: AC
  Administered 2020-05-12: 25 mL
  Filled 2020-05-12: qty 50

## 2020-05-12 MED ORDER — VITAL HIGH PROTEIN PO LIQD
1000.0000 mL | ORAL | Status: DC
  Administered 2020-05-12: 1000 mL

## 2020-05-12 MED ORDER — OSMOLITE 1.5 CAL PO LIQD
1000.0000 mL | ORAL | Status: DC
  Administered 2020-05-13 – 2020-05-15 (×4): 1000 mL

## 2020-05-12 MED ORDER — POTASSIUM PHOSPHATES 15 MMOLE/5ML IV SOLN
20.0000 mmol | Freq: Once | INTRAVENOUS | Status: AC
  Administered 2020-05-12: 20 mmol via INTRAVENOUS
  Filled 2020-05-12: qty 6.67

## 2020-05-12 MED ORDER — POTASSIUM PHOSPHATES 15 MMOLE/5ML IV SOLN
30.0000 mmol | Freq: Once | INTRAVENOUS | Status: AC
  Administered 2020-05-12: 30 mmol via INTRAVENOUS
  Filled 2020-05-12: qty 10

## 2020-05-12 MED ORDER — PROSOURCE TF PO LIQD
90.0000 mL | Freq: Two times a day (BID) | ORAL | Status: DC
  Administered 2020-05-12 – 2020-05-15 (×6): 90 mL
  Filled 2020-05-12 (×7): qty 90

## 2020-05-12 MED ORDER — ACETAMINOPHEN 325 MG PO TABS: 650.0000 mg | ORAL_TABLET | Freq: Four times a day (QID) | ORAL | Status: DC

## 2020-05-12 MED ORDER — POTASSIUM CHLORIDE 10 MEQ/50ML IV SOLN
10.0000 meq | INTRAVENOUS | Status: AC
  Administered 2020-05-12 (×4): 10 meq via INTRAVENOUS
  Filled 2020-05-12 (×4): qty 50

## 2020-05-12 MED ORDER — VALPROATE SODIUM 100 MG/ML IV SOLN
3000.0000 mg | Freq: Once | INTRAVENOUS | Status: AC
  Administered 2020-05-12: 3000 mg via INTRAVENOUS
  Filled 2020-05-12: qty 30

## 2020-05-12 MED ORDER — MAGNESIUM SULFATE 4 GM/100ML IV SOLN
4.0000 g | Freq: Once | INTRAVENOUS | Status: AC
  Administered 2020-05-12: 4 g via INTRAVENOUS
  Filled 2020-05-12: qty 100

## 2020-05-12 MED ORDER — VITAL HIGH PROTEIN PO LIQD: 1000.0000 mL | ORAL | Status: AC

## 2020-05-12 MED ORDER — VALPROATE SODIUM 100 MG/ML IV SOLN
500.0000 mg | Freq: Three times a day (TID) | INTRAVENOUS | Status: DC
  Administered 2020-05-12 – 2020-05-15 (×8): 500 mg via INTRAVENOUS
  Filled 2020-05-12 (×12): qty 5

## 2020-05-12 MED ORDER — DEXTROSE 50 % IV SOLN
25.0000 mL | Freq: Once | INTRAVENOUS | Status: AC
  Administered 2020-05-12: 25 mL via INTRAVENOUS

## 2020-05-12 MED ORDER — PROSOURCE TF PO LIQD
45.0000 mL | Freq: Two times a day (BID) | ORAL | Status: DC
  Administered 2020-05-12: 45 mL

## 2020-05-12 MED ORDER — ACETAMINOPHEN 160 MG/5ML PO SOLN
650.0000 mg | Freq: Four times a day (QID) | ORAL | Status: DC
  Administered 2020-05-12 – 2020-05-15 (×12): 650 mg
  Filled 2020-05-12 (×10): qty 20.3

## 2020-05-12 NOTE — Progress Notes (Signed)
Initial Nutrition Assessment  DOCUMENTATION CODES:   Not applicable  INTERVENTION:   Tube feeding via OG tube: 4/23 transition off Vital High Protein Osmolite 1.5 at 50 ml/h (1200 ml per day) Prosource TF 90 ml BID  Provides 1960 kcal, 119 gm protein, 912 ml free water daily  TF regimen and propofol at current rate providing 2656 total kcal/day   Continue to monitor electrolytes and replace   NUTRITION DIAGNOSIS:   Inadequate oral intake related to inability to eat as evidenced by NPO status.  GOAL:   Patient will meet greater than or equal to 90% of their needs  MONITOR:   TF tolerance  REASON FOR ASSESSMENT:   Consult,Ventilator Enteral/tube feeding initiation and management  ASSESSMENT:   Pt admitted after OOH cardiac arrest, unwitnessed, unknown downtime.    Per CCM continue current level of support, tentative plans for prognostication Monday (4/25). No family at time of visit.   4/22 pt heavily sedated to control anoxic myoclonus/epilepticus  Patient is currently intubated on ventilator support MV: 15.9 L/min Temp (24hrs), Avg:98.5 F (36.9 C), Min:97.5 F (36.4 C), Max:99.9 F (37.7 C)  Propofol: 26.4 ml/hr provides: 696 kcal  Medications reviewed and include: SSI 10 mEq KCl x 4  30 mmol Kphos x 1 Nabicarb @ 100 ml/hr  Labs reviewed: K+ 2.9, PO4: 1.1, Magnesium: 1.3, TG: 220   Current TF per protocol:  Vital High Protein @ 40 ml/hr with 45 ml ProSource TF BID Provides: 1040 kcal and 106 grams protein   NUTRITION - FOCUSED PHYSICAL EXAM:  Flowsheet Row Most Recent Value  Orbital Region No depletion  Upper Arm Region No depletion  Thoracic and Lumbar Region No depletion  Buccal Region No depletion  Temple Region No depletion  Clavicle Bone Region No depletion  Clavicle and Acromion Bone Region No depletion  Scapular Bone Region No depletion  Dorsal Hand No depletion  Patellar Region No depletion  Anterior Thigh Region No depletion   Posterior Calf Region No depletion  Edema (RD Assessment) None  Hair Reviewed  Eyes Unable to assess  Mouth Unable to assess  Skin Reviewed  Nails Reviewed       Diet Order:   Diet Order            Diet NPO time specified  Diet effective now                 EDUCATION NEEDS:   No education needs have been identified at this time  Skin:  Skin Assessment: Reviewed RN Assessment  Last BM:  unknown  Height:   Ht Readings from Last 1 Encounters:  04/22/2020 5\' 11"  (1.803 m)    Weight:   Wt Readings from Last 1 Encounters:  05/12/20 115 kg    Ideal Body Weight:  78.1 kg  BMI:  Body mass index is 35.36 kg/m.  Estimated Nutritional Needs:   Kcal:  2300-2500  Protein:  115-130 grams  Fluid:  >2 L/day  05/14/20., RD, LDN, CNSC See AMiON for contact information

## 2020-05-12 NOTE — Procedures (Addendum)
Patient Name: Frank Payne  MRN: [id number]  Epilepsy Attending: Charlsie Quest  Referring Physician/Provider: Raymon Mutton, NP Duration: 04/30/2020 1342 to 05/12/2020 1342  Patient history: 55 year old male status post cardiac arrest.  EEG to evaluate for seizures.   Level of alertness: comatose  AEDs during EEG study: Propofol, Versed, LEV  Technical aspects: This EEG study was done with scalp electrodes positioned according to the 10-20 International system of electrode placement. Electrical activity was acquired at a sampling rate of 500Hz  and reviewed with a high frequency filter of 70Hz  and a low frequency filter of 1Hz . EEG data were recorded continuously and digitally stored.   Description: Initially, patient was noted to have brief generalized whole body jerking every 10-15 seconds on average. Concomitant EEG showed generalized polyspikes consistent with myoclonic seizures. As sedation was increased, EEG also showed burst suppression pattern with bursts of highly epileptiform discharges lasting 2-4 seconds and eeg suppression lasting 10-15 seconds. EEG was not reactive to tactile stimulation. Hyperventilation and photic stimulation were not performed.     ABNORMALITY -Myoclonic seizures, generalized -Burst suppression with highly epileptiform bursts, generalized  IMPRESSION: This study showed myoclonic seizures every 10-15 seconds. As sedation was increased, EEG showed generalized epileptogenicity as well as profound diffuse encephalopathy. In the setting of cardiac arrest, this is most likely suggestive of anoxic/hypoxic brain injury.  Kaitlen Redford 

## 2020-05-12 NOTE — Progress Notes (Signed)
   NAME:  KENZIE FLAKES, MRN:  [id number], DOB:  [birth year], LOS: 1 ADMISSION DATE:  2020/06/06, CONSULTATION DATE:  2020-06-06 REFERRING MD:  Deretha Emory, CHIEF COMPLAINT:  Cardiac arrest   History of Present Illness:  55 year old man with unk PMH presenting with cardiac arrest. OOH, unwitnessed, unk downtime, PEA initial rhythm CPR x 10 mins before ROSC Given sedation en route for question agitation Question if narcan helped Now in myoclonus Labs are pending CT head is pending PCCM asked to admit  Pertinent  Medical History  Unknown  Significant Hospital Events: Including procedures, antibiotic start and stop dates in addition to other pertinent events   . 4/21 admitted, TTM  Interim History / Subjective:  On high dose sedation to try to control anoxic myoclonus/epilepticus. On paralytic for uncontrolled shivering and spiking temps.  Objective   Blood pressure 104/78, pulse (!) 108, temperature 98.6 F (37 C), temperature source Bladder, resp. rate (!) 28, height 5\' 11"  (1.803 m), weight 115 kg, SpO2 96 %. CVP:  [10 mmHg] 10 mmHg  Vent Mode: PRVC FiO2 (%):  [40 %] 40 % Set Rate:  [28 bmp] 28 bmp Vt Set:  [600 mL] 600 mL PEEP:  [5 cmH20] 5 cmH20 Plateau Pressure:  [16 cmH20-19 cmH20] 19 cmH20   Intake/Output Summary (Last 24 hours) at 05/12/2020 0908 Last data filed at 05/12/2020 05/14/2020 Gross per 24 hour  Intake 2924.45 ml  Output 2175 ml  Net 749.45 ml   Filed Weights   Jun 06, 2020 0917 05/12/20 0400  Weight: 110 kg 115 kg    Examination: Constitutional: unresponsive man sedated and paralyzed  Eyes: pinpoint, not reactive, not tracking Ears, nose, mouth, and throat: ETT with minimal secretions Cardiovascular: RRR, ext warm Respiratory: Clear, no wheezing, passive on vent Gastrointestinal: soft, hypoactive BS Skin: No rashes, normal turgor Neurologic: GCS 3, paralyzed Psychiatric: cannot assess     Labs/imaging that I havepersonally reviewed  (right click and  "Reselect all SmartList Selections" daily)  K/Mg low repleting Acute liver injury improved Acute kidney injury improved No new imaging  Resolved Hospital Problem list   n/a  Assessment & Plan:  OOH PEA Arrest suspect prolonged downtime.  EKG benign.  UDS neg (was given benzo by EMS) Anoxic myoclonus and encephalopathy Acute hypoxemic respiratory failure with likely aspiration pneumonitis  - Continue vent support, VAP prevention bundle - DC paralytic, start standing tylenol - Prop/versed to help with myoclonus; increase ceiling on versed, continue vEEG, appreciate Dr. 05/14/20 help - Ceftriaxone x 5 days for aspiration, vent support with VAP prevention bundle - Guarded prognosis relayed to family, continue current level of support but revisit if any clinical deterioration; tentative plans for prognostication Monday  Best practice (right click and "Reselect all SmartList Selections" daily)  Diet:  Start TF Pain/Anxiety/Delirium protocol (if indicated): Yes (RASS goal -4) VAP protocol (if indicated): Yes DVT prophylaxis: SCD GI prophylaxis: PPI Glucose control:  SSI Yes Central venous access:  Yes, and it is still needed Arterial line:  N/A Foley:  Yes, and it is still needed Mobility:  bed rest  PT consulted: N/A Last date of multidisciplinary goals of care discussion 4/21, due again 4/28 Code Status: DNR Disposition: ICU  43 minutes cc time not including any separately billable procedures  5/28 MD PCCM

## 2020-05-12 NOTE — Progress Notes (Signed)
LTM maint complete - serviced A1. [facility] monitored-event button tested and verified.

## 2020-05-12 NOTE — Progress Notes (Signed)
eLink Physician-Brief Progress Note Patient Name: Frank Payne DOB: 1965-08-03 MRN: [id number]   Date of Service  05/12/2020  HPI/Events of Note  Pt on TTM for normothermia.  RN reports he spiked temp earlier & cooled, but water temp is dropping again & she thinks he's trying to spike a temp again.  No prn orders.  RN asking for order for tylenol. AST and ALT both elevated, therefore, patient can't have Tylenol. Creatinine = 0.85  eICU Interventions  Plan: 1. Motrin suspension 400 mg per tube X 1.      Intervention Category Major Interventions: Other:  Lenell Antu 05/12/2020, 12:22 AM

## 2020-05-12 NOTE — Progress Notes (Signed)
eLink Physician-Brief Progress Note Patient Name: Frank Payne DOB: 11-24-1965 MRN: [id number]   Date of Service  05/12/2020  HPI/Events of Note  Hypokalemia  Hypophosphatemia - K+ = 3.3, PO4--- = 2.4 and Creatinine = 1.35.  eICU Interventions  Will replace K+ and PO4---.     Intervention Category Major Interventions: Electrolyte abnormality - evaluation and management  Lenell Antu 05/12/2020, 9:23 PM

## 2020-05-12 NOTE — Progress Notes (Signed)
There was some confusion about POA; have found a sister from [place]. Explained prognosis and clinical scenario. She would like him full code until she arrives tomorrow morning.  Myrla Halsted MD PCCM

## 2020-05-12 NOTE — Progress Notes (Signed)
RT NOTE: patient does not meet SBT criteria for this AM.  Patient currently on paralytic.  Tolerating current ventilator settings well at this time.  Will continue to monitor.

## 2020-05-12 NOTE — Progress Notes (Signed)
Subjective: Jerking improved overnight but resumed again this morning.   ROS: Unable to obtain due to poor mental status  Examination  Vital signs in last 24 hours: Temp:  [97.5 F (36.4 C)-99.9 F (37.7 C)] 98.6 F (37 C) (04/22 0600) Pulse Rate:  [92-124] 95 (04/22 1600) Resp:  [0-31] 22 (04/22 1600) BP: (77-148)/(62-100) 87/68 (04/22 1600) SpO2:  [95 %-100 %] 96 % (04/22 1600) FiO2 (%):  [40 %] 40 % (04/22 1600) Weight:  [161 kg] 115 kg (04/22 0400)  General: lying in bed, NAD CVS: pulse-normal rate and rhythm RS: intubated, coarse breath sounds BL Extremities: cool Neuro: Comatose, does not open eyes to noxious stimuli, corneal reflex absent, gag reflex absent, doesnt withdraw in all extremities noxious with stimulation  Basic Metabolic Panel: Recent Labs  Lab 04/26/2020 0825 04/25/2020 0848 05/20/2020 1135 04/24/2020 2017 05/12/20 0425  NA 138 141  --  138 141  K 3.4* 3.3*  --  3.6 2.9*  CL 105  --   --  103 107  CO2 15*  --   --  24 25  GLUCOSE 285*  --   --  98 88  BUN 8  --   --  9 6  CREATININE 1.26*  --  1.17 0.85 0.95  CALCIUM 8.0*  --   --  8.3* 8.0*  MG  --   --   --   --  1.3*  PHOS  --   --   --   --  1.1*    CBC: Recent Labs  Lab 05/03/2020 0825 05/20/2020 0848 05/14/2020 1135 05/12/20 0425  WBC 11.9*  --  13.1* 8.7  NEUTROABS 7.8*  --   --   --   HGB 15.5 16.3 15.8 13.9  HCT 47.1 48.0 47.2 39.5  MCV 101.5*  --  99.8 95.0  PLT 263  --  262 184     Coagulation Studies: Recent Labs    05/19/2020 06-May-2015  LABPROT 14.3  INR 1.1    Imaging No new brain imaging overnight  ASSESSMENT AND PLAN: 55 year old male status post cardiac arrest, noted to have myoclonic seizures on arrival.  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 -LTM EEG consistent with myoclonic seizure and profound diffuse encephalopathy most likely due to anoxic-hypoxic brain injury  Recommendations: - Load with VPA 3000mg  once and start 500mg  q8h -Continue  keppra 1000 mg twice daily -If seizures continue, can add clonopin -Also on propofol and Versed, will titrate for seizure suppression -We will plan to obtain MRI brain at 72 hours(05/14/2020) to look for anoxic/hypoxic brain injury - on 4/21: Discussed with patient's family/niece that myoclonic seizures in setting of outside hospital cardiac arrest with prolonged downtime is most likely suggestive of significant neurologic injury with minimal to no chances of meaningful neurologic recovery.  Family made him DNR and would like to continue with current treatment.  However if patient remains comatose, they would likely consider transition to comfort care.  Thank you for allowing 05/16/2020 to participate in the care of this patient. If you have any further questions, please contact  me or neurohospitalist.    CRITICAL CARE Performed by: 5/21   Total critical care time: 35 minutes  Critical care time was exclusive of separately billable procedures and treating other patients.  Critical care was necessary to treat or prevent imminent or life-threatening deterioration.  Critical care was time spent personally by me on the following activities: development of treatment plan with patient and/or  surrogate as well as nursing, discussions with consultants, evaluation of patient's response to treatment, examination of patient, obtaining history from patient or surrogate, ordering and performing treatments and interventions, ordering and review of laboratory studies, ordering and review of radiographic studies, pulse oximetry and re-evaluation of patient's condition.    Lindie Spruce Epilepsy [facility] For questions after 5pm please refer to AMION to reach the Neurologist on call

## 2020-05-12 NOTE — Social Work (Signed)
CSW received consult to help determine family contact for patient. CSW spoke with patients step mom Frank Payne at bedside. Frank Payne explained patient is from home alone. Patient was here in [place] for work. Frank Payne reports all family is from [place]. Frank Payne called patients sister on speaker phone to speak with CSW. CSW spoke with patients sister formally known as Frank Payne but current name is Frank Payne. Frank Payne reports patient has 5 sisters Frank Payne,Frank Payne,Frank Payne,Frank Payne, and Frank Payne.Frank Payne reports that patient does not have POA paperwork. Frank Payne does not currently have other siblings telephone numbers. Frank Payne confirms she will provide other telephone numbers of patients sisters to patients RN tomorrow. Frank Payne reports she will have discussion with her sisters tomorrow to determine who will be patients primary contact and decision maker.CSW spoke with [facility] leadership Frank Payne who confirmed to speak with patients sister Frank Payne until we can get contact information to other siblings. CSW will continue to follow.

## 2020-05-13 ENCOUNTER — Other Ambulatory Visit (HOSPITAL_COMMUNITY): Payer: Self-pay

## 2020-05-13 ENCOUNTER — Encounter (HOSPITAL_COMMUNITY): Payer: Self-pay

## 2020-05-13 DIAGNOSIS — G40901 Epilepsy, unspecified, not intractable, with status epilepticus: Secondary | ICD-10-CM

## 2020-05-13 LAB — PHOSPHORUS
Phosphorus: 6.7 mg/dL — ABNORMAL HIGH (ref 2.5–4.6)
Phosphorus: 3.3 mg/dL (ref 2.5–4.6)

## 2020-05-13 LAB — GLUCOSE, CAPILLARY
Glucose-Capillary: 80 mg/dL (ref 70–99)
Glucose-Capillary: 89 mg/dL (ref 70–99)
Glucose-Capillary: 91 mg/dL (ref 70–99)
Glucose-Capillary: 109 mg/dL — ABNORMAL HIGH (ref 70–99)
Glucose-Capillary: 92 mg/dL (ref 70–99)
Glucose-Capillary: 88 mg/dL (ref 70–99)

## 2020-05-13 LAB — BASIC METABOLIC PANEL
Anion gap: 9 (ref 5–15)
Anion gap: 8 (ref 5–15)
BUN: 8 mg/dL (ref 6–20)
BUN: 9 mg/dL (ref 6–20)
CO2: 25 mmol/L (ref 22–32)
CO2: 24 mmol/L (ref 22–32)
Calcium: 7.7 mg/dL — ABNORMAL LOW (ref 8.9–10.3)
Calcium: 8.3 mg/dL — ABNORMAL LOW (ref 8.9–10.3)
Chloride: 102 mmol/L (ref 98–111)
Chloride: 112 mmol/L — ABNORMAL HIGH (ref 98–111)
Creatinine, Ser: 1.11 mg/dL (ref 0.61–1.24)
Creatinine, Ser: 0.94 mg/dL (ref 0.61–1.24)
GFR, Estimated: >60 mL/min (ref >60)
GFR, Estimated: >60 mL/min (ref >60)
Glucose, Bld: 186 mg/dL — ABNORMAL HIGH (ref 70–99)
Glucose, Bld: 104 mg/dL — ABNORMAL HIGH (ref 70–99)
Potassium: 4.5 mmol/L (ref 3.5–5.1)
Potassium: 4.6 mmol/L (ref 3.5–5.1)
Sodium: 136 mmol/L (ref 135–145)
Sodium: 144 mmol/L (ref 135–145)

## 2020-05-13 LAB — RENAL FUNCTION PANEL
Albumin: 2.7 g/dL — ABNORMAL LOW (ref 3.5–5.0)
Anion gap: 9 (ref 5–15)
BUN: 8 mg/dL (ref 6–20)
CO2: 25 mmol/L (ref 22–32)
Calcium: 7.8 mg/dL — ABNORMAL LOW (ref 8.9–10.3)
Chloride: 107 mmol/L (ref 98–111)
Creatinine, Ser: 1.04 mg/dL (ref 0.61–1.24)
GFR, Estimated: >60 mL/min (ref >60)
Glucose, Bld: 90 mg/dL (ref 70–99)
Phosphorus: 3.7 mg/dL (ref 2.5–4.6)
Potassium: 3.3 mmol/L — ABNORMAL LOW (ref 3.5–5.1)
Sodium: 141 mmol/L (ref 135–145)

## 2020-05-13 LAB — HEPATIC FUNCTION PANEL
ALT: 40 U/L (ref 0–44)
AST: 63 U/L — ABNORMAL HIGH (ref 15–41)
Albumin: 2.7 g/dL — ABNORMAL LOW (ref 3.5–5.0)
Alkaline Phosphatase: 59 U/L (ref 38–126)
Bilirubin, Direct: 0.3 mg/dL — ABNORMAL HIGH (ref 0.0–0.2)
Indirect Bilirubin: 1.5 mg/dL — ABNORMAL HIGH (ref 0.3–0.9)
Total Bilirubin: 1.8 mg/dL — ABNORMAL HIGH (ref 0.3–1.2)
Total Protein: 5.4 g/dL — ABNORMAL LOW (ref 6.5–8.1)

## 2020-05-13 LAB — MAGNESIUM
Magnesium: 2.1 mg/dL (ref 1.7–2.4)
Magnesium: 2.4 mg/dL (ref 1.7–2.4)

## 2020-05-13 MED ORDER — POTASSIUM CHLORIDE 20 MEQ PO PACK
40.0000 meq | PACK | Freq: Once | ORAL | Status: AC
  Administered 2020-05-13: 40 meq
  Filled 2020-05-13: qty 2

## 2020-05-13 NOTE — Progress Notes (Signed)
EEG maintenance complete. No skin breakdown at FP1 FP2 F7 FZ

## 2020-05-13 NOTE — Progress Notes (Signed)
.  LTM maint complete All leads in place-[facility] monitored, Event button test confirmed by [facility].

## 2020-05-13 NOTE — Procedures (Addendum)
Patient Name:Frank Payne OVZ:[id number] Epilepsy Attending:Taryn Nave Annabelle Harman Referring Physician/Provider:Whitney Earlene Plater, NP Duration: 05/12/2020 1342 to 05/13/2020 1342  Patient history:55 year old male status post cardiac arrest. EEG to evaluate for seizures.   Level of alertness:comatose  AEDs during EEG study:Propofol, Versed, LEV, VPA  Technical aspects: This EEG study was done with scalp electrodes positioned according to the 10-20 International system of electrode placement. Electrical activity was acquired at a sampling rate of 500Hz  and reviewed with a high frequency filter of 70Hz  and a low frequency filter of 1Hz . EEG data were recorded continuously and digitally stored.   Description: EEG initally showed burst suppression pattern with bursts of sharply contoured 3-5hz  theta-delta slowing lasting 1-3 seconds and eeg suppression lasting 4-6 seconds.Gradually the duration and amplitude of bursts became shorter and eventually eeg showed background suppression. Event button was pressed on 05/13/2020 at 0115 for unclear reasons.Concomitant EEG before, during and after the event didn't show any eeg change to suggest seizure. Hyperventilation and photic stimulation were not performed.   ABNORMALITY - Burst suppression, generalized - Background suppression, generalized  IMPRESSION: This studyis suggestive of profound diffuse encephalopathy. No seizures and definite epileptiform discharges were seen. In the setting of cardiac arrest, this is most likely suggestive of anoxic/hypoxic brain injury.  Frank Payne 

## 2020-05-13 NOTE — Progress Notes (Signed)
ID: Frank Payne is a 55 y.o. male who was brought in 05/08/2020 after an out-of-hospital cardiac arrest with unknown downtime.  CPR was performed for about 10 minutes before ROSC.  Was then noted to have axial jerking.  Stat EEG showed myoclonic seizures.   Major events:  - 4/21 started on keppra, propofol and versed - 4/22 added VPA with marked improvement in myoclonic status - GoC -- On 4/21: Dr. Melynda Ripple discussed with patient's family/niece that myoclonic seizures in setting of outside hospital cardiac arrest with prolonged downtime is most likely suggestive of significant neurologic injury with minimal to no chances of meaningful neurologic recovery.  Family made him DNR and would like to continue with current treatment.  However if patient remains comatose, they would likely consider transition to comfort care. On CCM discussion with another on 4/22 family member, patient was made full code again pending further family visitation.     Subjective: Intubated and unresponsive  ROS: Unable to obtain due to poor mental status  Examination  Vital signs in last 24 hours: Temp:  [97.9 F (36.6 C)-99.1 F (37.3 C)] 99 F (37.2 C) (04/23 0600) Pulse Rate:  [28-108] 93 (04/23 0700) Resp:  [0-31] 28 (04/23 0700) BP: (77-116)/(61-91) 104/74 (04/23 0700) SpO2:  [94 %-100 %] 95 % (04/23 0700) FiO2 (%):  [40 %] 40 % (04/23 0400) Weight:  [117.2 kg] 117.2 kg (04/23 0422)  General: lying in bed, NAD CVS: pulse-normal rate and rhythm RS: intubated, coarse breath sounds BL, intermittently double stacking  Extremities: cool Neuro: Comatose, does not open eyes to noxious stimuli, corneal reflex absent (some corneal edema present), VOR absent, gag reflex absent, doesnt withdraw in all extremities noxious with stimulation   Basic Metabolic Panel: Recent Labs  Lab 04/21/2020 0825 05/14/2020 0848 05/02/2020 1135 05/12/2020 2017 05/12/20 0425 05/12/20 1733 05/12/20 1859 05/13/20 0309  NA 138 141  --   138 141  --  140 136  K 3.4* 3.3*  --  3.6 2.9*  --  3.3* 4.5  CL 105  --   --  103 107  --  105 102  CO2 15*  --   --  24 25  --  24 25  GLUCOSE 285*  --   --  98 88  --  92 186*  BUN 8  --   --  9 6  --  9 8  CREATININE 1.26*  --  1.17 0.85 0.95  --  1.35* 1.11  CALCIUM 8.0*  --   --  8.3* 8.0*  --  7.9* 7.7*  MG  --   --   --   --  1.3* 2.2  --  2.1  PHOS  --   --   --   --  1.1* 2.4*  --  6.7*   Lab Results  Component Value Date   ALT 40 05/13/2020   AST 63 (H) 05/13/2020   ALKPHOS 59 05/13/2020   BILITOT 1.8 (H) 05/13/2020   Stable from prior   Estimated Creatinine Clearance: 101.4 mL/min (by C-G formula based on SCr of 1.11 mg/dL).   CBC: Recent Labs  Lab 04/21/2020 0825 05/16/2020 0848 04/21/2020 1135 05/12/20 0425  WBC 11.9*  --  13.1* 8.7  NEUTROABS 7.8*  --   --   --   HGB 15.5 16.3 15.8 13.9  HCT 47.1 48.0 47.2 39.5  MCV 101.5*  --  99.8 95.0  PLT 263  --  262 184  Coagulation Studies: Recent Labs    04/22/2020 May 20, 2015  LABPROT 14.3  INR 1.1    Lab Results  Component Value Date   VALPROATE 91 05/12/2020   Post load level on 4/22    Imaging No new brain imaging overnight  ASSESSMENT AND PLAN: 55 year old male status post cardiac arrest, noted to have myoclonic seizures on arrival.  He has responded well to the valproic acid but now has essentially no cerebral activity on EEG.  Goal will be to keep him suppressed for at least 24 hours, but will wean sedation just slightly today to try to recover some evidence of activity.   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 -LTM EEG consistent with myoclonic seizure and profound diffuse encephalopathy most likely due to anoxic-hypoxic brain injury  Recommendations: - s/p load with VPA 3000mg  4/22, post load level appropriate at 90  - continue 500mg  q8h -Continue keppra 1000 mg twice daily -If seizures continue, can add clonopin -Also on propofol and Versed, titrated for seizure  suppression  -Currently on propofol 25 mcg, fentanyl 100 mcg, Versed 45 mg/h  -Reduce Versed to 30 mg/h -We will plan to obtain MRI brain at 72 hours(05/14/2020) to look for anoxic/hypoxic brain injury  Thank you for allowing to participate in the care of this patient. If you have any further questions, please contact  me or neurohospitalist.    CRITICAL CARE Performed by:  05/16/2020 MD-PhD [facility] [phone] Available 7 AM to 7 PM, outside these hours please contact Neurologist on call listed on AMION   Total critical care time: 40 minutes  Critical care time was exclusive of separately billable procedures and treating other patients.  Critical care was necessary to treat or prevent imminent or life-threatening deterioration.  Critical care was time spent personally by me on the following activities: development of treatment plan with patient and/or surrogate as well as nursing, discussions with consultants, evaluation of patient's response to treatment, examination of patient, obtaining history from patient or surrogate, ordering and performing treatments and interventions, ordering and review of laboratory studies, ordering and review of radiographic studies, pulse oximetry and re-evaluation of patient's condition.

## 2020-05-13 NOTE — Progress Notes (Signed)
   NAME:  Frank Payne, MRN:  [id number], DOB:  [birth year], LOS: 2 ADMISSION DATE:  04/29/2020, CONSULTATION DATE:  04/30/2020 REFERRING MD:  Deretha Emory, CHIEF COMPLAINT:  Cardiac arrest   History of Present Illness:  55 year old man with unk PMH presenting with cardiac arrest. OOH, unwitnessed, unk downtime, PEA initial rhythm CPR x 10 mins before ROSC Given sedation en route for question agitation Question if narcan helped Now in myoclonus Labs are pending CT head is pending PCCM asked to admit  Pertinent  Medical History  Unknown  Significant Hospital Events: Including procedures, antibiotic start and stop dates in addition to other pertinent events   . 4/21 admitted, TTM  Interim History / Subjective:  Remains on high dose sedation. Keep this way x 24-48h per neuro. Family should be coming in today.  Objective   Blood pressure 110/74, pulse (!) 106, temperature 98.8 F (37.1 C), resp. rate (!) 28, height 5\' 11"  (1.803 m), weight 117.2 kg, SpO2 93 %.    Vent Mode: PRVC FiO2 (%):  [40 %] 40 % Set Rate:  [28 bmp] 28 bmp Vt Set:  [600 mL] 600 mL PEEP:  [5 cmH20] 5 cmH20 Plateau Pressure:  [20 cmH20-24 cmH20] 24 cmH20   Intake/Output Summary (Last 24 hours) at 05/13/2020 05/02/2020 Last data filed at 05/13/2020 0830 Gross per 24 hour  Intake 7259.87 ml  Output 4680 ml  Net 2579.87 ml   Filed Weights   04/27/2020 0917 05/12/20 0400 05/13/20 0422  Weight: 110 kg 115 kg 117.2 kg    Examination: Constitutional: heavily sedated man on vent  Eyes: pupils pinpoint, not reactive Ears, nose, mouth, and throat: ETT in place, small thick secretions, having lots of purulent material from nostrils Cardiovascular: tachycardic, ext warm Respiratory: rhonci worse on L, passive on vent Gastrointestinal: soft, +BS Skin: No rashes, normal turgor Neurologic: GCS3 on current sedation Psychiatric: cannot assess  Cooling blanker still low: continues to try to spike fevers    Labs/imaging  that I havepersonally reviewed  (right click and "Reselect all SmartList Selections" daily)  CBG slightly low Cr okay K low being replaced CBC benign No new chest imaging  Resolved Hospital Problem list   n/a  Assessment & Plan:  OOH PEA Arrest suspect prolonged downtime.  EKG benign.  UDS neg (was given benzo by EMS) Anoxic myoclonus and encephalopathy Acute hypoxemic respiratory failure with likely aspiration pneumonitis Complex family dynamics- a sister from 05/01/2020 is apparently [place] and is en route  - Continue vent support, VAP prevention bundle - Prop/versed to burst suppressions x 24-48 hours then slowly wean with vEEG, appreciate neuro help - Ceftriaxone x 5 days for aspiration, vent support with VAP prevention bundle - Guarded prognosis relayed to family, continue current level of support but revisit if any clinical deterioration; tentative plans for prognostication Monday  Best practice (right click and "Reselect all SmartList Selections" daily)  Diet:  TF Pain/Anxiety/Delirium protocol (if indicated): Yes (RASS goal -4) VAP protocol (if indicated): Yes DVT prophylaxis: SCD GI prophylaxis: PPI Glucose control:  SSI Yes Central venous access:  Yes, and it is still needed Arterial line:  N/A Foley:  Yes, and it is still needed Mobility:  bed rest  PT consulted: N/A Last date of multidisciplinary goals of care discussion 4/21, due again 4/28 Code Status: full code (reversed 4/22 as new POA emerged) Disposition: ICU  33 minutes cc time not including any separately billable procedures  5/22 MD PCCM

## 2020-05-14 ENCOUNTER — Inpatient Hospital Stay (HOSPITAL_COMMUNITY): Payer: Self-pay

## 2020-05-14 DIAGNOSIS — J9601 Acute respiratory failure with hypoxia: Secondary | ICD-10-CM

## 2020-05-14 DIAGNOSIS — R569 Unspecified convulsions: Secondary | ICD-10-CM

## 2020-05-14 LAB — HEPATIC FUNCTION PANEL
ALT: 36 U/L (ref 0–44)
AST: 81 U/L — ABNORMAL HIGH (ref 15–41)
Albumin: 2.6 g/dL — ABNORMAL LOW (ref 3.5–5.0)
Alkaline Phosphatase: 75 U/L (ref 38–126)
Bilirubin, Direct: 0.5 mg/dL — ABNORMAL HIGH (ref 0.0–0.2)
Indirect Bilirubin: 1.1 mg/dL — ABNORMAL HIGH (ref 0.3–0.9)
Total Bilirubin: 1.6 mg/dL — ABNORMAL HIGH (ref 0.3–1.2)
Total Protein: 6 g/dL — ABNORMAL LOW (ref 6.5–8.1)

## 2020-05-14 LAB — BASIC METABOLIC PANEL
Anion gap: 8 (ref 5–15)
BUN: 10 mg/dL (ref 6–20)
CO2: 23 mmol/L (ref 22–32)
Calcium: 8.2 mg/dL — ABNORMAL LOW (ref 8.9–10.3)
Chloride: 111 mmol/L (ref 98–111)
Creatinine, Ser: 0.88 mg/dL (ref 0.61–1.24)
GFR, Estimated: >60 mL/min (ref >60)
Glucose, Bld: 112 mg/dL — ABNORMAL HIGH (ref 70–99)
Potassium: 3.6 mmol/L (ref 3.5–5.1)
Sodium: 142 mmol/L (ref 135–145)

## 2020-05-14 LAB — GLUCOSE, CAPILLARY
Glucose-Capillary: 101 mg/dL — ABNORMAL HIGH (ref 70–99)
Glucose-Capillary: 102 mg/dL — ABNORMAL HIGH (ref 70–99)
Glucose-Capillary: 113 mg/dL — ABNORMAL HIGH (ref 70–99)
Glucose-Capillary: 74 mg/dL (ref 70–99)
Glucose-Capillary: 92 mg/dL (ref 70–99)
Glucose-Capillary: 99 mg/dL (ref 70–99)

## 2020-05-14 LAB — PHOSPHORUS: Phosphorus: 3.5 mg/dL (ref 2.5–4.6)

## 2020-05-14 LAB — MAGNESIUM: Magnesium: 2.3 mg/dL (ref 1.7–2.4)

## 2020-05-14 MED ORDER — SODIUM CHLORIDE 0.9% FLUSH
10.0000 mL | Freq: Two times a day (BID) | INTRAVENOUS | Status: DC
  Administered 2020-05-14 – 2020-05-15 (×2): 10 mL

## 2020-05-14 MED ORDER — SODIUM CHLORIDE 0.9% FLUSH: 10.0000 mL | INTRAVENOUS | Status: DC | PRN

## 2020-05-14 MED ORDER — POTASSIUM CHLORIDE 20 MEQ PO PACK
40.0000 meq | PACK | Freq: Once | ORAL | Status: AC
  Administered 2020-05-14: 40 meq
  Filled 2020-05-14: qty 2

## 2020-05-14 NOTE — Progress Notes (Signed)
Patient transported on vent to MRI and returned to 2H13 without complications.

## 2020-05-14 NOTE — Progress Notes (Signed)
EEG electrodes removed for MRI - will be rehooked when patient is back

## 2020-05-14 NOTE — Plan of Care (Signed)
  Problem: Clinical Measurements: Goal: Will remain free from infection Outcome: Progressing   Problem: Nutrition: Goal: Adequate nutrition will be maintained Outcome: Progressing   Problem: Safety: Goal: Ability to remain free from injury will improve Outcome: Progressing   Problem: Skin Integrity: Goal: Risk for impaired skin integrity will decrease Outcome: Progressing   

## 2020-05-14 NOTE — Procedures (Addendum)
Patient Name:Frank Payne VWU:[id number] Epilepsy Attending:Maciel Kegg Annabelle Harman Referring Physician/Provider:Whitney Earlene Plater, NP Duration:05/13/2020 [phone] to 4/24/20221342  Patient history:55 year old male status post cardiac arrest. EEG to evaluate for seizures.   Level of alertness:comatose  AEDs during EEG study:Propofol, Versed, LEV, VPA  Technical aspects: This EEG study was done with scalp electrodes positioned according to the 10-20 International system of electrode placement. Electrical activity was acquired at a sampling rate of 500Hz  and reviewed with a high frequency filter of 70Hz  and a low frequency filter of 1Hz . EEG data were recorded continuously and digitally stored.   Description:EEG showed continuous generalized background suppression. EEG was not reactive to tactile stimulation. Hyperventilation and photic stimulation were not performed.   ABNORMALITY - Background suppression, generalized  IMPRESSION: This studyis suggestive of profound diffuse encephalopathy. No seizures and definite epileptiform discharges were seen. In the setting of cardiac arrest, this is mostlikely suggestive of anoxic/hypoxic brain injury.  [name] 

## 2020-05-14 NOTE — Progress Notes (Signed)
NAME:  CHRISTY FRIEDE, MRN:  [id number], DOB:  01-16-1966, LOS: 3 ADMISSION DATE:  Jun 07, 2020, CONSULTATION DATE:  06-07-2020 REFERRING MD:  Deretha Emory, CHIEF COMPLAINT:  Cardiac arrest   History of Present Illness:  55 year old man with unknonwn PMH presenting with cardiac arrest. OOH, unwitnessed, unknonwn downtime, PEA initial rhythm.  CPR x 10 mins before ROSC. Given sedation en route for question agitation. Question if narcan helped. Myoclonus on admit. TTM protocol initiated. LTM EEG ongoing with burst suppression.  Pertinent  Medical History  Unknown  Significant Hospital Events: Including procedures, antibiotic start and stop dates in addition to other pertinent events   . 4/21 admitted, TTM. Myoclonous. Neurology consulted, initiated keppra, propofol & versed. Family in [place] made him DNR but continue current treatment . 4/22 Discussion with different family member > revoked code status to full code.  . 4/24 Cooling pads in place, EEG ongoing, on versed, fentanyl, propofol  Interim History / Subjective:  Tmax 99.2  Remains on sedation > propofol 25 mcg, versed 5mg , fentanyl 100 mcg EEG ongoing  Glucose range 99-112 I/O 4L UOP, -1.2L in last 24 hours  Objective   Blood pressure 104/75, pulse (!) 106, temperature 99.2 F (37.3 C), resp. rate (!) 32, height 5\' 11"  (1.803 m), weight 115.2 kg, SpO2 93 %.    Vent Mode: PRVC FiO2 (%):  [40 %] 40 % Set Rate:  [28 bmp] 28 bmp Vt Set:  [600 mL] 600 mL PEEP:  [5 cmH20] 5 cmH20 Plateau Pressure:  [26 cmH20-27 cmH20] 27 cmH20   Intake/Output Summary (Last 24 hours) at 05/14/2020 1013 Last data filed at 05/14/2020 0700 Gross per 24 hour  Intake 2030.44 ml  Output 3150 ml  Net -1119.56 ml   Filed Weights   05/12/20 0400 05/13/20 0422 05/14/20 0349  Weight: 115 kg 117.2 kg 115.2 kg    Examination: General: critically ill appearing adult male lying in bed in NAD on vent HEENT: MM pink/moist, EEG leads in place, ETT, pupils  pinpoint Neuro: sedated, +gag with suction.  Otherwise not able to assess neuro status due to sedation  CV: s1s2 RRR, no m/r/g PULM: non-labored on vent, creamy white secretions suctioned from ballard, clear bilaterally  GI: soft, bsx4 active  Extremities: warm/dry, no edema  Skin: no rashes or lesions  Resolved Hospital Problem list   N/a  Assessment & Plan:   OOH PEA Arrest - suspect prolonged downtime.  EKG benign.  UDS neg (was given benzo by EMS).   Anoxic myoclonus and encephalopathy Acute hypoxemic respiratory failure with likely aspiration pneumonitis Complex family dynamics - a sister from 05/12/2020 is apparently 05/16/20 and is en route  -PRVC 8cc/kg  -wean PEEP / fiO2 for sats >90% -VAP prevention measures  -follow intermittent CXR  -D4/5 ceftriaxone for aspiration  -continue TTM protocol -continue burst suppression with slow wean, appreciate Neurology assistance with prognostication  -plan to revisit prognostication evaluation on 4/25  -monitor electrolytes, replace as indicated  -hold TF for MRI, restart afternoon 4/24  -MRI brain pending   Best practice (right click and "Reselect all SmartList Selections" daily)  Diet:  TF Pain/Anxiety/Delirium protocol (if indicated): Yes (RASS goal -4) VAP protocol (if indicated): Yes DVT prophylaxis: SCD GI prophylaxis: PPI Glucose control:  SSI Yes Central venous access:  Yes, and it is still needed Arterial line:  N/A Foley:  Yes, and it is still needed Mobility:  bed rest  PT consulted: N/A Last date of multidisciplinary goals of care  discussion 4/21, due again 4/28 Code Status: full code (reversed 4/22 as new POA emerged) Disposition: ICU       Critical Care Time: 32 minutes   Canary Brim, MSN, APRN, NP-C, AGACNP-BC [facility] 05/14/2020, 10:34 AM   Please see [email or web address] for pager details.   From 7A-7P if no response, please call [phone] After hours, please call ELink  [phone]

## 2020-05-14 NOTE — Plan of Care (Cosign Needed Addendum)
  Interdisciplinary Goals of Care Family Meeting   Date carried out:: 05/14/2020  Location of the meeting: Bedside  Member's involved: Nurse Practitioner and Family Member or next of kin  Durable Power of Attorney or acting medical decision maker: Mother, Sisters, Brother-in-Law, [place], Niece-in-law (7 family members present for meeting, 2 on phone via face time)  Discussion: We discussed goals of care for Frank Payne .  We reviewed details of Frank Payne's admission to include cardiac arrest with prolonged downtime before ROSC.  We discussed the implications of lack of oxygen to the brain.  MRI findings consistent with global hypoxic-ischemic injury, and cerebral edema.  We discussed the physiology of herniation and the potential implications in this setting.  Family indicates understanding.  We reviewed plan for lightening sedation in am 4/25 to assess neurological status. Family understands that this is likely to be a poor outcome.  The indicate he would not want to live in a vegetative state, he would want to be fully restored. We discussed changing his code status to DNR in the event of arrest and they are all in agreement.  Support offered to family.  Will plan to meet with family at 11 am on 4/25 to review neuro status with them and plan of care.    Code status: Full DNR.  Continue current supportive measures.   Disposition: Continue current acute care  Time spent for the meeting: 60 minutes    Plan:  -RN to wean sedation to off starting am 4/25 at 0800 -if myoclonus or abnormal motor movements, please call for bedside assessment  -will revisit exam findings with family at 1100 am 4/25 -family requested to take patient belongings > hospital bag with clothes, shoes, clear back with hotel key card, credit card, keys, ear buds, cell phone given to family   Frank Brim, MSN, APRN, NP-C, AGACNP-BC [facility] 05/14/2020, 4:04 PM   Please see [email or web address] for  pager details.   From 7A-7P if no response, please call [phone] After hours, please call ELink [phone]

## 2020-05-14 NOTE — Progress Notes (Signed)
Patient rehooked with EEG electrodes.

## 2020-05-14 NOTE — Progress Notes (Signed)
ID: Frank Payne is a 55 y.o. male who was brought in 06/07/2020 after an out-of-hospital cardiac arrest with unknown downtime.  CPR was performed for about 10 minutes before ROSC.  Was then noted to have axial jerking.  Stat EEG showed myoclonic seizures.   Major events:  - 4/21 started on keppra, propofol and versed - 4/22 added VPA with marked improvement in myoclonic status - 4/23 EEG flat, weaned from Versed from 45 mg/h to 5 mg/h slowly  - GoC -- On 4/21: Dr. Melynda Ripple discussed with patient's family/niece that myoclonic seizures in setting of outside hospital cardiac arrest with prolonged downtime is most likely suggestive of significant neurologic injury with minimal to no chances of meaningful neurologic recovery.  Family made him DNR and would like to continue with current treatment.  However if patient remains comatose, they would likely consider transition to comfort care. On CCM discussion with another on 4/22 family member, patient was made full code again pending further family visitation.     Subjective: Intubated and unresponsive Rewarming has begun  ROS: Unable to obtain due to poor mental status  Examination  Vital signs in last 24 hours: Temp:  [98.4 F (36.9 C)-99.2 F (37.3 C)] 99.2 F (37.3 C) (04/24 0803) Pulse Rate:  [101-111] 106 (04/24 0730) Resp:  [15-47] 32 (04/24 0730) BP: (99-130)/(72-94) 104/75 (04/24 0730) SpO2:  [93 %-96 %] 93 % (04/24 0730) FiO2 (%):  [40 %] 40 % (04/24 0407) Weight:  [115.2 kg] 115.2 kg (04/24 0349)  General: lying in bed, NAD CVS: pulse-normal rate and rhythm RS: intubated, coarse breath sounds BL, not breathing over the vent per nurse Extremities: cool Neuro: Comatose, does not open eyes to noxious stimuli,  pupils slightly irregular pinpoint and fixed at 1.5 mm bilaterally, corneal reflex present bilaterally but weak (light cotton stimulation of cornea required, some corneal edema still present), VOR absent, gag reflex absent,  doesnt withdraw in all extremities noxious with stimulation   Basic Metabolic Panel: Recent Labs  Lab 05/12/20 0425 05/12/20 1733 05/12/20 1859 05/13/20 0309 05/13/20 0616 05/13/20 1602 05/14/20 0502  NA 141  --  140 136 141 144 142  K 2.9*  --  3.3* 4.5 3.3* 4.6 3.6  CL 107  --  105 102 107 112* 111  CO2 25  --  24 25 25 24 23   GLUCOSE 88  --  92 186* 90 104* 112*  BUN 6  --  9 8 8 9 10   CREATININE 0.95  --  1.35* 1.11 1.04 0.94 0.88  CALCIUM 8.0*  --  7.9* 7.7* 7.8* 8.3* 8.2*  MG 1.3* 2.2  --  2.1  --  2.4 2.3  PHOS 1.1* 2.4*  --  6.7* 3.7 3.3 3.5   Lab Results  Component Value Date   ALT 36 05/14/2020   AST 81 (H) 05/14/2020   ALKPHOS 75 05/14/2020   BILITOT 1.6 (H) 05/14/2020   Stable from prior   Estimated Creatinine Clearance: 126.8 mL/min (by C-G formula based on SCr of 0.88 mg/dL).   CBC: Recent Labs  Lab 07-Jun-2020 0825 06/07/2020 0848 07-Jun-2020 1135 05/12/20 0425  WBC 11.9*  --  13.1* 8.7  NEUTROABS 7.8*  --   --   --   HGB 15.5 16.3 15.8 13.9  HCT 47.1 48.0 47.2 39.5  MCV 101.5*  --  99.8 95.0  PLT 263  --  262 184     Coagulation Studies: Recent Labs    06-07-2020 05-28-2015  LABPROT 14.3  INR 1.1    Lab Results  Component Value Date   VALPROATE 91 05/12/2020   Post load level on 4/22    Imaging No new brain imaging overnight  ASSESSMENT AND PLAN: 55 year old male status post cardiac arrest, noted to have myoclonic seizures on arrival.  He has responded well to the valproic acid but now has essentially no cerebral activity on EEG.  We will plan to obtain MRI today for prognostication, continue LTM and weaning sedation pending MRI results.   At this time his exam is concerning for loss of most of his brainstem reflexes but he has regained corneals bilaterally which are very weak.  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 -LTM EEG consistent with myoclonic seizure and profound diffuse encephalopathy most likely due to  anoxic-hypoxic brain injury  Recommendations: - s/p load with VPA 3000mg  4/22, post load level appropriate at 90  - continue 500mg  q8h Depakote -Continue keppra 1000 mg twice daily -If seizures recur, consider Klonopin -Also on propofol and Versed, titrated for seizure suppression  -Currently on propofol 25 mcg, fentanyl 100 mcg, Versed 5 mg/h  -Reduce Versed to 30 mg/h -We will follow-up MRI today, EEG to be temporarily discontinued for MRI  Thank you for allowing 5/22 to participate in the care of this patient. If you have any further questions, please contact as below.    CRITICAL CARE Performed by:  [place] MD-PhD [facility] [phone] Available 7 AM to 7 PM, outside these hours please contact Neurologist on call listed on AMION   Total critical care time: 40 minutes  Critical care time was exclusive of separately billable procedures and treating other patients.  Critical care was necessary to treat or prevent imminent or life-threatening deterioration.  Critical care was time spent personally by me on the following activities: development of treatment plan with patient and/or surrogate as well as nursing, discussions with consultants, evaluation of patient's response to treatment, examination of patient, obtaining history from patient or surrogate, ordering and performing treatments and interventions, ordering and review of laboratory studies, ordering and review of radiographic studies, pulse oximetry and re-evaluation of patient's condition.

## 2020-05-15 ENCOUNTER — Inpatient Hospital Stay (HOSPITAL_COMMUNITY): Payer: Self-pay

## 2020-05-15 DIAGNOSIS — G931 Anoxic brain damage, not elsewhere classified: Secondary | ICD-10-CM

## 2020-05-15 LAB — CBC
HCT: 40.5 % (ref 39.0–52.0)
Hemoglobin: 13.2 g/dL (ref 13.0–17.0)
MCH: 33.2 pg (ref 26.0–34.0)
MCHC: 32.6 g/dL (ref 30.0–36.0)
MCV: 102 fL — ABNORMAL HIGH (ref 80.0–100.0)
Platelets: 182 10*3/uL (ref 150–400)
RBC: 3.97 MIL/uL — ABNORMAL LOW (ref 4.22–5.81)
RDW: 13.8 % (ref 11.5–15.5)
WBC: 14.2 10*3/uL — ABNORMAL HIGH (ref 4.0–10.5)
nRBC: 0 % (ref 0.0–0.2)

## 2020-05-15 LAB — HEPATIC FUNCTION PANEL
ALT: 48 U/L — ABNORMAL HIGH (ref 0–44)
AST: 89 U/L — ABNORMAL HIGH (ref 15–41)
Albumin: 2.5 g/dL — ABNORMAL LOW (ref 3.5–5.0)
Alkaline Phosphatase: 92 U/L (ref 38–126)
Bilirubin, Direct: 0.7 mg/dL — ABNORMAL HIGH (ref 0.0–0.2)
Indirect Bilirubin: 1.2 mg/dL — ABNORMAL HIGH (ref 0.3–0.9)
Total Bilirubin: 1.9 mg/dL — ABNORMAL HIGH (ref 0.3–1.2)
Total Protein: 6.7 g/dL (ref 6.5–8.1)

## 2020-05-15 LAB — GLUCOSE, CAPILLARY
Glucose-Capillary: 95 mg/dL (ref 70–99)
Glucose-Capillary: 162 mg/dL — ABNORMAL HIGH (ref 70–99)
Glucose-Capillary: 94 mg/dL (ref 70–99)

## 2020-05-15 LAB — TRIGLYCERIDES: Triglycerides: 99 mg/dL (ref <150)

## 2020-05-15 LAB — BASIC METABOLIC PANEL
Anion gap: 9 (ref 5–15)
BUN: 12 mg/dL (ref 6–20)
CO2: 23 mmol/L (ref 22–32)
Calcium: 8.6 mg/dL — ABNORMAL LOW (ref 8.9–10.3)
Chloride: 109 mmol/L (ref 98–111)
Creatinine, Ser: 0.89 mg/dL (ref 0.61–1.24)
GFR, Estimated: >60 mL/min (ref >60)
Glucose, Bld: 105 mg/dL — ABNORMAL HIGH (ref 70–99)
Potassium: 4.3 mmol/L (ref 3.5–5.1)
Sodium: 141 mmol/L (ref 135–145)

## 2020-05-15 MED ORDER — GLYCOPYRROLATE 0.2 MG/ML IJ SOLN: 0.2000 mg | INTRAMUSCULAR | Status: DC | PRN

## 2020-05-15 MED ORDER — ACETAMINOPHEN 650 MG RE SUPP: 650.0000 mg | Freq: Four times a day (QID) | RECTAL | Status: DC | PRN

## 2020-05-15 MED ORDER — MORPHINE SULFATE (PF) 2 MG/ML IV SOLN: 2.0000 mg | INTRAVENOUS | Status: DC | PRN

## 2020-05-15 MED ORDER — ACETAMINOPHEN 325 MG PO TABS: 650.0000 mg | ORAL_TABLET | Freq: Four times a day (QID) | ORAL | Status: DC | PRN

## 2020-05-15 MED ORDER — LORAZEPAM 2 MG/ML IJ SOLN
2.0000 mg | INTRAMUSCULAR | Status: DC | PRN
  Administered 2020-05-15: 4 mg via INTRAVENOUS
  Filled 2020-05-15: qty 2

## 2020-05-15 MED ORDER — POLYVINYL ALCOHOL 1.4 % OP SOLN
1.0000 [drp] | Freq: Four times a day (QID) | OPHTHALMIC | Status: DC | PRN
  Filled 2020-05-15: qty 15

## 2020-05-15 MED ORDER — DEXTROSE 5 % IV SOLN: INTRAVENOUS | Status: DC

## 2020-05-15 MED ORDER — DIPHENHYDRAMINE HCL 50 MG/ML IJ SOLN: 25.0000 mg | INTRAMUSCULAR | Status: DC | PRN

## 2020-05-15 MED ORDER — MORPHINE BOLUS VIA INFUSION
5.0000 mg | INTRAVENOUS | Status: DC | PRN
  Filled 2020-05-15: qty 5

## 2020-05-15 MED ORDER — MORPHINE 100MG IN NS 100ML (1MG/ML) PREMIX INFUSION
0.0000 mg/h | INTRAVENOUS | Status: DC
  Administered 2020-05-15: 5 mg/h via INTRAVENOUS
  Filled 2020-05-15: qty 100

## 2020-05-15 MED ORDER — GLYCOPYRROLATE 1 MG PO TABS
1.0000 mg | ORAL_TABLET | ORAL | Status: DC | PRN
  Filled 2020-05-15: qty 1

## 2020-05-18 ENCOUNTER — Telehealth: Payer: Self-pay | Admitting: Pulmonary Disease

## 2020-05-21 NOTE — Progress Notes (Signed)
Patient passed at 1309hrs, confirmed with Sabino Gasser, notified attending physician, honor bridge, and medical examiner. As per medical examiner patient is not a medical examiner's case

## 2020-05-21 NOTE — Progress Notes (Signed)
Subjective: No acute events overnight.  ROS: Unable to obtain due to poor mental status  Examination  Vital signs in last 24 hours: Temp:  [98.4 F (36.9 C)-99.9 F (37.7 C)] 99.2 F (37.3 C) (04/25 1128) Pulse Rate:  [97-124] 106 (04/25 0817) Resp:  [0-48] 28 (04/25 0817) BP: (117-145)/(80-98) 135/86 (04/25 0800) SpO2:  [84 %-100 %] 96 % (04/25 1120) FiO2 (%):  [40 %-60 %] 40 % (04/25 1120) Weight:  [113.9 kg] 113.9 kg (04/25 0600)  General: lying in bed, NAD CVS: pulse-normal rate and rhythm RS: intubated, coarse breath sounds BL Extremities: cool Neuro: Comatose, does not open eyes to noxious stimuli,PERRLA, corneal reflex present, gag reflex present, doesnt withdraw in all extremities noxious with stimulation  Basic Metabolic Panel: Recent Labs  Lab 05/12/20 0425 05/12/20 1733 05/12/20 1859 05/13/20 0309 05/13/20 0616 05/13/20 1602 05/14/20 0502 05/07/2020 0448  NA 141  --    < > 136 141 144 142 141  K 2.9*  --    < > 4.5 3.3* 4.6 3.6 4.3  CL 107  --    < > 102 107 112* 111 109  CO2 25  --    < > 25 25 24 23 23   GLUCOSE 88  --    < > 186* 90 104* 112* 105*  BUN 6  --    < > 8 8 9 10 12   CREATININE 0.95  --    < > 1.11 1.04 0.94 0.88 0.89  CALCIUM 8.0*  --    < > 7.7* 7.8* 8.3* 8.2* 8.6*  MG 1.3* 2.2  --  2.1  --  2.4 2.3  --   PHOS 1.1* 2.4*  --  6.7* 3.7 3.3 3.5  --    < > = values in this interval not displayed.    CBC: Recent Labs  Lab 05/20/2020 0825 05/20/20 0848 2020-05-20 1135 05/12/20 0425 05/20/2020 0448  WBC 11.9*  --  13.1* 8.7 14.2*  NEUTROABS 7.8*  --   --   --   --   HGB 15.5 16.3 15.8 13.9 13.2  HCT 47.1 48.0 47.2 39.5 40.5  MCV 101.5*  --  99.8 95.0 102.0*  PLT 263  --  262 184 182     Coagulation Studies: No results for input(s): LABPROT, INR in the last 72 hours.  Imaging MRI brain without contrast 05/14/2020: Findings consistent with global hypoxic-ischemic injury. No evidence of herniation or hemorrhage.     ASSESSMENT AND PLAN:  55 year old male status post cardiac arrest, noted to have myoclonic seizures on arrival.  Anoxic brain injury  -LTM EEG showed background suppression without any reactivity  Recommendations: -Continue Keppra and Depakote -Patient has been off sedation since last night.  He does have cranial nerve reflexes but no movement in all extremities.  Patient's history, exam, MRI findings and EEG findings together suggest devastating neurologic injury with minimal to no chance of meaningful neurologic recovery.  Per ICU team, patient's family came to see patient yesterday and had mentioned that patient would not want to live on long-term life support.   Thank you for allowing 05/16/2020 to participate in the care of this patient. If you have any further questions, please contact me or neurohospitalist.    I have spent a total of  25  minutes with the patient reviewing hospital notes,  test results, labs and examining the patient as well as establishing an assessment and plan.  > 50% of time was spent in direct  patient care.  Lindie Spruce Epilepsy [facility] For questions after 5pm please refer to AMION to reach the Neurologist on call

## 2020-05-21 NOTE — Progress Notes (Signed)
Assisted tele visit to patient with family member.  Rajvir Ernster M, RN  

## 2020-05-21 NOTE — Progress Notes (Signed)
Family updated at bedside per Dr. Tonia Brooms.  They elect to proceed with compassionate extubation / withdrawal of care.     Comfort orders placed.  Support offered to family.    Canary Brim, MSN, APRN, NP-C, AGACNP-BC  [facility] 05/04/2020, 12:01 PM   Please see [email or web address] for pager details.   From 7A-7P if no response, please call [phone] After hours, please call ELink [phone]

## 2020-05-21 NOTE — Procedures (Addendum)
Patient Name:Ching DENARIO BAGOT NSQ:[id number] Epilepsy Attending:Jylan Loeza Annabelle Harman Referring Physician/Provider:Whitney Earlene Plater, NP Duration:05/14/2020 [phone] to 05/12/2020 1205  Patient history:55 year old male status post cardiac arrest. EEG to evaluate for seizures.   Level of alertness:comatose  AEDs during EEG study:LEV, VPA  Technical aspects: This EEG study was done with scalp electrodes positioned according to the 10-20 International system of electrode placement. Electrical activity was acquired at a sampling rate of 500Hz  and reviewed with a high frequency filter of 70Hz  and a low frequency filter of 1Hz . EEG data were recorded continuously and digitally stored.   Description:EEG showed continuous generalized background suppression. EEG was not reactive to tactile stimulation. Hyperventilation and photic stimulation were not performed.   ABNORMALITY - Background suppression, generalized  IMPRESSION: This studyis suggestive ofprofound diffuse encephalopathy. No seizures and definite epileptiform discharges were seen.In the setting of cardiac arrest, this is mostlikely suggestive of anoxic/hypoxic brain injury.  Kareem Cathey 

## 2020-05-21 NOTE — Progress Notes (Signed)
LTM EEG discontinued - no skin breakdown at unhook.   

## 2020-05-21 NOTE — Progress Notes (Signed)
Patient extubated per comfort care measures. RN at bedside.  

## 2020-05-21 NOTE — Progress Notes (Addendum)
NAME:  Frank Payne, MRN:  [id number], DOB:  1965-08-01, LOS: 4 ADMISSION DATE:  04/25/2020, CONSULTATION DATE:  05/02/2020 REFERRING MD:  Deretha Emory, CHIEF COMPLAINT:  Cardiac arrest   History of Present Illness:  55 year old man with unknonwn PMH who presented 4/21 with cardiac arrest.  He suffered an OOH arrest, unwitnessed, unknown downtime, PEA initial rhythm.  CPR x 10 mins before ROSC. Given sedation en route for question agitation. Question if narcan helped. Myoclonus on admit. TTM protocol initiated. LTM EEG ongoing with burst suppression. Treated with antiepileptic's and burst suppression.    Pertinent  Medical History  Unknown  Significant Hospital Events: Including procedures, antibiotic start and stop dates in addition to other pertinent events   . 4/21 admitted, TTM. Myoclonous. Neurology consulted, initiated keppra, propofol & versed. Family in [place] made him DNR but continue current treatment . 4/22 Discussion with different family member > revoked code status to full code.  . 4/24 Cooling pads in place, EEG ongoing, on versed, fentanyl, propofol  Interim History / Subjective:  Sedation off per Neuro since 7:30PM 4/24  Tmax 99.9  RN reports no acute activities   Objective   Blood pressure 137/84, pulse (!) 111, temperature 99.9 F (37.7 C), temperature source Axillary, resp. rate (!) 28, height 5\' 11"  (1.803 m), weight 113.9 kg, SpO2 94 %.    Vent Mode: PRVC FiO2 (%):  [40 %-60 %] 40 % Set Rate:  [28 bmp] 28 bmp Vt Set:  [600 mL] 600 mL PEEP:  [5 cmH20] 5 cmH20 Plateau Pressure:  [21 cmH20-26 cmH20] 22 cmH20   Intake/Output Summary (Last 24 hours) at 18-May-2020 0734 Last data filed at May 18, 2020 0551 Gross per 24 hour  Intake 1727.72 ml  Output 1430 ml  Net 297.72 ml   Filed Weights   05/13/20 0422 05/14/20 0349 18-May-2020 0600  Weight: 117.2 kg 115.2 kg 113.9 kg    Examination: General: critically ill appearing adult male lying in bed on vent in NAD HEENT: MM  pink/moist, ETT, scleral edema, ETT Neuro: lying in bed, no response to voice, pupils 74mm difficult to discern reaction to light, sluggish at best. No cough or gag with suctioning. No response on uppers or lowers to painful stimuli CV: s1s2 RRR, no m/r/g PULM: non-labored on vent, lungs bilaterally clear  GI: soft, bsx4 active  Extremities: warm/dry, trace dependent edema  Skin: no rashes or lesions, multiple tattoos  PCXR 4/25 >> images personally reviewed, rotated imaged, ETT & TLC in good position, mild basilar atx L>R  Resolved Hospital Problem list      Assessment & Plan:   OOH PEA Arrest.  Anoxic Myoclonus and Acute Hypoxic Encephalopathy Out of hospital post arrest, suspect prolonged downtime.  EKG benign.  UDS neg (was given benzo by EMS).  ETOH 141 on presentation. MRI with diffuse cerebral edema.  -supportive care -follow neuro exam, plan to revisit prognostication with Neuro 4/25, suspect grim prognosis  -continue EEG  -continue antiepileptics  -monitor off sedation   Acute Hypoxemic Respiratory Failure with Aspiration Pneumonitis -PRVC 8cc/kg  -wean PEEP / fiO2 for sats >90% -follow intermittent CXR  -mental status precludes SBT  -follow exam off sedation  -D5/5 ceftriaxone for possible aspiration   Elevated LFT's Suspect secondary to poor perfusion / post arrest  -follow LFT's   At Risk Malnutrition  -continue TF    Best practice (right click and "Reselect all SmartList Selections" daily)  Diet:  TF Pain/Anxiety/Delirium protocol (if indicated): No, off for  neuro exam  VAP protocol (if indicated): Yes DVT prophylaxis: SCD GI prophylaxis: PPI Glucose control:  SSI Yes Central venous access:  Yes, and it is still needed Arterial line:  N/A Foley:  Yes, and it is still needed Mobility:  bed rest  PT consulted: N/A Last date of multidisciplinary goals of care discussion 4/25 Code Status: full code (reversed 4/22 as new POA emerged) Disposition:  ICU       Critical Care Time: 34 minutes   Frank Brim, MSN, APRN, NP-C, AGACNP-BC [facility] 05/14/2020, 7:34 AM   Please see [email or web address] for pager details.   From 7A-7P if no response, please call [phone] After hours, please call [place] [phone]   Pulmonary critical care attending:  This is a 55 year old gentleman unknown past medical history suffered cardiac arrest out of hospital.  Unknown downtime unwitnessed initial PEA rhythm.  Patient had significant myoclonus on admission.  Treated with antiepileptics and burst suppression.  MRI concerning for severe anoxic brain injury.  Neurology has seen and consulted on the patient.  EEG with severe profuse encephalopathy concerning for anoxia.  Image results reviewed, neurology consultation reviewed, family documentation by Frank Brim, NP reviewed.  BP 135/86 (BP Location: Left Arm)   Pulse (!) 106   Temp 98.8 F (37.1 C) (Axillary)   Resp (!) 28   Ht 5\' 11"  (1.803 m)   Wt 113.9 kg   SpO2 95%   BMI 35.02 kg/m   General: Middle-aged male intubated on mechanical life support Heart: Regular rhythm S1-S2 Lungs: Bilateral mechanically ventilated breath sounds Abdomen: Mild distention. Neuro: No cough no gag no pupillary responses, no response to painful  MRI results reviewed. EEG results reviewed.  Assessment: Severe anoxic brain injury Out-of-hospital cardiac arrest PEA cardiac arrest. Acute hypoxemic respiratory failure, aspiration pneumonitis On mechanical life support, mechanical ventilator Elevated LFTs, likely shock liver postarrest.  Plan: Family meeting planned today at 11 AM. We will discuss consideration for transition to comfort care measures. Patient remains off sedation with no significant improvement in neurologic exam. Continue supportive care at this time. Continue antiepileptics Remains off sedation Complete 5 days ceftriaxone.  More plan of care details following  family meeting.  This patient is critically ill with multiple organ system failure; which, requires frequent high complexity decision making, assessment, support, evaluation, and titration of therapies. This was completed through the application of advanced monitoring technologies and extensive interpretation of multiple databases. During this encounter critical care time was devoted to patient care services described in this note for 36 minutes.  , DO [facility] 04/28/2020 11:07 AM

## 2020-05-21 NOTE — Death Summary Note (Signed)
DEATH SUMMARY   Patient Details  Name: Frank Payne MRN: [id number] DOB: [birth year]  Admission/Discharge Information   Admit Date:  06-03-2020  Date of Death: Date of Death: 06/07/2020  Time of Death: Time of Death: 06-14-07  Length of Stay: 4  Referring Physician: Pcp, No   Reason(s) for Hospitalization  55 year old man with unknonwn PMH who presented 06/04/2022 with cardiac arrest.  He suffered an OOH arrest, unwitnessed, unknown downtime, PEA initial rhythm.  CPR x 10 mins before ROSC. Given sedation en route for question agitation. Question if narcan helped. Myoclonus on admit. TTM protocol initiated. LTM EEG ongoing with burst suppression. Treated with antiepileptic's and burst suppression.    Diagnoses  Preliminary cause of death: Anoxic brain injury (HCC) Secondary Diagnoses (including complications and co-morbidities):  Active Problems:   Cardiac arrest [facility])   Brief Hospital Course (including significant findings, care, treatment, and services provided and events leading to death)  Frank Payne is a 55 y.o. year old male who with unknonwn PMH who presented 2022-06-04 with cardiac arrest.  He suffered an OOH arrest, unwitnessed, unknown downtime, PEA initial rhythm.  CPR x 10 mins before ROSC. Given sedation en route for question agitation. Question if narcan helped. Myoclonus on admit. TTM protocol initiated. LTM EEG ongoing with burst suppression. Treated with antiepileptic's and burst suppression.     2022/06/04 admitted, TTM. Myoclonous. Neurology consulted, initiated keppra, propofol & versed. Family in [place] made him DNR but continue current treatment  4/22 Discussion with different family member > revoked code status to full code.   4/24 Cooling pads in place, EEG ongoing, on versed, fentanyl, propofol  2022-06-08 comfort care withdrawal, anoxic brain injury  Patient suffered an out-of-hospital cardiac arrest, PEA, developed severe anoxic brain injury, anoxic myoclonic's, acute metabolic  encephalopathy secondary to above.  Patient had acute hypoxemic respiratory failure from aspiration pneumonitis postarrest.  After discussing patient's significant anoxic brain injury decision was made for comfort care withdrawal.  There was approximately 9 family members present via telephone services video chat and 3 in person at the time.  Patient was transition to comfort care measures and passed peacefully in the ICU.    Pertinent Labs and Studies  Significant Diagnostic Studies DG Abd 1 View  Result Date: 05/14/2020 CLINICAL DATA:  MRI clearance EXAM: ABDOMEN - 1 VIEW COMPARISON:  None. FINDINGS: The bowel gas pattern is normal. An enteric tube courses below the diaphragm with distal tip and side hole terminating within the distal stomach. No radio-opaque calculi or other significant radiographic abnormality are seen. No metallic foreign body is seen within the abdomen or pelvis. IMPRESSION: 1. No metallic foreign body identified within the abdomen or pelvis. 2. The enteric tube terminates within the distal stomach. Electronically Signed   By: Duanne Guess D.O.   On: 05/14/2020 11:13   CT Head Wo Contrast  Result Date: 2020/06/03 CLINICAL DATA:  Found unresponsive in cardiac arrest. EXAM: CT HEAD WITHOUT CONTRAST TECHNIQUE: Contiguous axial images were obtained from the base of the skull through the vertex without intravenous contrast. COMPARISON:  None. FINDINGS: Brain: The brain shows a normal appearance without evidence of malformation, atrophy, old or acute small or large vessel infarction, mass lesion, hemorrhage, hydrocephalus or extra-axial collection. Vascular: No hyperdense vessel. No evidence of atherosclerotic calcification. Skull: Normal.  No traumatic finding.  No focal bone lesion. Sinuses/Orbits: Sinuses are clear. Orbits appear normal. Mastoids are clear. Other: None significant IMPRESSION: Normal head CT. Electronically Signed   By:  Paulina Fusi M.D.   On: 06/07/20 11:11    MR BRAIN WO CONTRAST  Result Date: 05/14/2020 CLINICAL DATA:  Cardiac arrest. EXAM: MRI HEAD WITHOUT CONTRAST TECHNIQUE: Multiplanar, multiecho pulse sequences of the brain and surrounding structures were obtained without intravenous contrast. COMPARISON:  Head CT 2020-06-07 FINDINGS: Brain: There is abnormal cortically based restricted diffusion diffusely throughout both cerebral hemispheres as well as in the cerebellum and bilateral basal ganglia. There is associated mild to moderate edema. There is no midline shift, tonsillar herniation, intracranial hemorrhage, mass, or extra-axial fluid collection. The ventricles are normal in size. Vascular: Major intracranial vascular flow voids are preserved. Skull and upper cervical spine: Unremarkable bone marrow signal. Sinuses/Orbits: Unremarkable orbits. Moderate mucosal thickening and fluid scattered throughout the paranasal sinuses. Bilateral mastoid and middle ear effusions. Other: None. IMPRESSION: Findings consistent with global hypoxic-ischemic injury. No evidence of herniation or hemorrhage. Electronically Signed   By: Sebastian Ache M.D.   On: 05/14/2020 12:29   DG CHEST PORT 1 VIEW  Result Date: 05/14/2020 CLINICAL DATA:  Acute respiratory failure with hypoxia. EXAM: PORTABLE CHEST 1 VIEW COMPARISON:  07-Jun-2020. FINDINGS: Stable cardiomediastinal silhouette. Endotracheal and nasogastric tubes are unchanged in position. Left internal jugular catheter is unchanged. No pneumothorax is noted. Mild bibasilar subsegmental atelectasis or infiltrates are noted, left greater than right. Small left pleural effusion may be present. Bony thorax is unremarkable. IMPRESSION: Stable support apparatus. Mild bibasilar subsegmental atelectasis or infiltrates are noted, left greater than right. Small left pleural effusion. Electronically Signed   By: Lupita Raider M.D.   On: 05/04/2020 08:27   DG CHEST PORT 1 VIEW  Result Date: 2020-06-07 CLINICAL DATA:   Central line placement. EXAM: PORTABLE CHEST 1 VIEW COMPARISON:  Same day. FINDINGS: Stable cardiomediastinal silhouette. Endotracheal tube is unchanged in position. Nasogastric tube is seen entering stomach. Interval placement of left internal jugular catheter with distal tip in expected position of the SVC. No pneumothorax or pleural effusion is noted. Lungs are clear. Bony thorax is unremarkable. IMPRESSION: Endotracheal and nasogastric tubes are in grossly good position. Interval placement of left internal jugular catheter with distal tip in expected position of the SVC. No pneumothorax is noted. Electronically Signed   By: Lupita Raider M.D.   On: 06/07/20 13:08   DG Chest Port 1 View  Result Date: 06/07/20 CLINICAL DATA:  Status post cardiac arrest. Intubated. EXAM: PORTABLE CHEST 1 VIEW COMPARISON:  Thirteen 2021 FINDINGS: Endotracheal tube in satisfactory position. Stable enlarged cardiac silhouette. Interval mild patchy opacity in the left lung, most pronounced in the perihilar region. Clear right lung. Normal vascularity. Unremarkable bones. IMPRESSION: 1. Interval mild left lung patchy atelectasis, pneumonia or pulmonary contusion. 2. Stable cardiomegaly. 3. Endotracheal tube in satisfactory position. Electronically Signed   By: Beckie Salts M.D.   On: 06-07-20 08:44   EEG adult  Result Date: 2020-06-07 Charlsie Quest, MD     06-07-2020  1:58 PM Patient Name: Frank Payne MRN: [id number] Epilepsy Attending: Charlsie Quest Referring Physician/Provider: Raymon Mutton, NP Date: 06-07-2020 Duration: 26.50 mins Patient history: 55 year old male status post cardiac arrest.  EEG to evaluate for seizures. Level of alertness: comatose AEDs during EEG study: Propofol, Versed Technical aspects: This EEG study was done with scalp electrodes positioned according to the 10-20 International system of electrode placement. Electrical activity was acquired at a sampling rate of  and reviewed with  a high frequency filter of  and a low frequency filter of  1Hz . EEG data were recorded continuously and digitally stored. Description: Patient was noted to have brief generalized whole body jerking every 4 to 8 seconds on average. Concomitant EEG showed generalized polyspikes consistent with myoclonic seizures.  EEG also showed continuous generalized background suppression in between seizures.  EEG was not reactive to tactile stimulation. Hyperventilation and photic stimulation were not performed.   ABNORMALITY -Myoclonic seizures, generalized -Background suppression, generalized IMPRESSION: This study showed myoclonic seizures every 4 to 8 seconds as well as profound diffuse encephalopathy.  In the setting of cardiac arrest, this is likely suggestive of anoxic/hypoxic brain injury. Dr. was notified. Priyanka Katrinka Blazing   Overnight EEG with video  Result Date: 05/12/2020 05/14/2020, MD     05/13/2020  8:19 AM Patient Name: Frank Payne MRN: Kerri Perches Epilepsy Attending: [id number] Referring Physician/Provider: Charlsie Quest, NP Duration: 06/10/2020 1342 to 05/12/2020 1342  Patient history: 55 year old male status post cardiac arrest.  EEG to evaluate for seizures.  Level of alertness: comatose  AEDs during EEG study: Propofol, Versed, LEV  Technical aspects: This EEG study was done with scalp electrodes positioned according to the 10-20 International system of electrode placement. Electrical activity was acquired at a sampling rate of 500Hz  and reviewed with a high frequency filter of 70Hz  and a low frequency filter of 1Hz . EEG data were recorded continuously and digitally stored.  Description: Initially, patient was noted to have brief generalized whole body jerking every 10-15 seconds on average. Concomitant EEG showed generalized polyspikes consistent with myoclonic seizures. As sedation was increased, EEG also showed burst suppression pattern with bursts of highly epileptiform  discharges lasting 2-4 seconds and eeg suppression lasting 10-15 seconds. EEG was not reactive to tactile stimulation. Hyperventilation and photic stimulation were not performed.    ABNORMALITY -Myoclonic seizures, generalized -Burst suppression with highly epileptiform bursts, generalized  IMPRESSION: This study showed myoclonic seizures every 10-15 seconds. As sedation was increased, EEG showed generalized epileptogenicity as well as profound diffuse encephalopathy. In the setting of cardiac arrest, this is most likely suggestive of anoxic/hypoxic brain injury.  44   ECHOCARDIOGRAM COMPLETE  Result Date: 2020-06-10    ECHOCARDIOGRAM REPORT   Patient Name:   Frank Payne Date of Exam: Jun 10, 2020 Medical Rec #:  Charlsie Quest        Height:       71.0 in Accession #:    05/13/2020       Weight:       242.5 lb Date of Birth:  January 16, 1966        BSA:          2.289 m Patient Age:    54 years         BP:           106/77 mmHg Patient Gender: M                HR:           109 bpm. Exam Location:  Inpatient Procedure: 2D Echo, 3D Echo, Cardiac Doppler and Color Doppler Indications:    Cardiac arrest. ; R00.9* Unspecified abnormalities of heart beat  History:        Patient has no prior history of Echocardiogram examinations.                 Arrythmias:Cardiac Arrest. No known history, patient found down.  Sonographer:    [id number] RDCS Referring Phys: [phone] [facility] F DAVIS  Sonographer Comments: Echo performed  with patient supine and on artificial respirator. IMPRESSIONS  1. Septal, apical and inferior basal hypokinesis . Left ventricular ejection fraction, by estimation, is 40 to 45%. The left ventricle has mildly decreased function. The left ventricle demonstrates regional wall motion abnormalities (see scoring diagram/findings for description). The left ventricular internal cavity size was mildly dilated. Left ventricular diastolic parameters are consistent with Grade II diastolic dysfunction  (pseudonormalization). Elevated left ventricular end-diastolic pressure.  2. Right ventricular systolic function is normal. The right ventricular size is normal.  3. The mitral valve is normal in structure. Mild mitral valve regurgitation. No evidence of mitral stenosis.  4. The aortic valve is normal in structure. Aortic valve regurgitation is not visualized. No aortic stenosis is present.  5. Aortic dilatation noted. There is moderate dilatation of the ascending aorta, measuring 43 mm.  6. The inferior vena cava is normal in size with greater than 50% respiratory variability, suggesting right atrial pressure of 3 mmHg. FINDINGS  Left Ventricle: Septal, apical and inferior basal hypokinesis. Left ventricular ejection fraction, by estimation, is 40 to 45%. The left ventricle has mildly decreased function. The left ventricle demonstrates regional wall motion abnormalities. The left ventricular internal cavity size was mildly dilated. There is no left ventricular hypertrophy. Left ventricular diastolic parameters are consistent with Grade II diastolic dysfunction (pseudonormalization). Elevated left ventricular end-diastolic pressure. Right Ventricle: The right ventricular size is normal. No increase in right ventricular wall thickness. Right ventricular systolic function is normal. Left Atrium: Left atrial size was normal in size. Right Atrium: Right atrial size was normal in size. Pericardium: There is no evidence of pericardial effusion. Mitral Valve: The mitral valve is normal in structure. There is mild thickening of the mitral valve leaflet(s). There is mild calcification of the mitral valve leaflet(s). Mild mitral valve regurgitation. No evidence of mitral valve stenosis. Tricuspid Valve: The tricuspid valve is normal in structure. Tricuspid valve regurgitation is not demonstrated. No evidence of tricuspid stenosis. Aortic Valve: The aortic valve is normal in structure. Aortic valve regurgitation is not  visualized. No aortic stenosis is present. Pulmonic Valve: The pulmonic valve was normal in structure. Pulmonic valve regurgitation is not visualized. No evidence of pulmonic stenosis. Aorta: The aortic root is normal in size and structure and aortic dilatation noted. There is moderate dilatation of the ascending aorta, measuring 43 mm. Venous: The inferior vena cava is normal in size with greater than 50% respiratory variability, suggesting right atrial pressure of 3 mmHg. IAS/Shunts: No atrial level shunt detected by color flow Doppler.  LEFT VENTRICLE PLAX 2D LVIDd:         5.20 cm      Diastology LVIDs:         3.90 cm      LV e' medial:    5.55 cm/s LV PW:         1.00 cm      LV E/e' medial:  16.1 LV IVS:        1.10 cm      LV e' lateral:   5.66 cm/s LVOT diam:     2.20 cm      LV E/e' lateral: 15.7 LV SV:         68 LV SV Index:   30 LVOT Area:     3.80 cm  LV Volumes (MOD) LV vol d, MOD A2C: 91.5 ml LV vol d, MOD A4C: 112.0 ml LV vol s, MOD A2C: 49.9 ml LV vol s, MOD A4C: 73.4 ml LV  SV MOD A2C:     41.6 ml LV SV MOD A4C:     112.0 ml LV SV MOD BP:      43.5 ml RIGHT VENTRICLE             IVC RV S prime:     12.90 cm/s  IVC diam: 1.50 cm TAPSE (M-mode): 1.9 cm LEFT ATRIUM             Index       RIGHT ATRIUM           Index LA diam:        2.85 cm 1.25 cm/m  RA Area:     14.50 cm LA Vol (A2C):   34.0 ml 14.86 ml/m RA Volume:   38.00 ml  16.60 ml/m LA Vol (A4C):   41.6 ml 18.18 ml/m LA Biplane Vol: 41.3 ml 18.05 ml/m  AORTIC VALVE LVOT Vmax:   123.00 cm/s LVOT Vmean:  85.100 cm/s LVOT VTI:    0.178 m  AORTA Ao Root diam: 4.00 cm Ao Asc diam:  4.30 cm MITRAL VALVE MV Area (PHT): 7.44 cm    SHUNTS MV Decel Time: 102 msec    Systemic VTI:  0.18 m MV E velocity: 89.10 cm/s  Systemic Diam: 2.20 cm MV A velocity: 92.10 cm/s MV E/A ratio:  0.97 Charlton Haws MD Electronically signed by Charlton Haws MD Signature Date/Time: 05/08/2020/3:30:25 PM    Final     Microbiology Recent Results (from the past 240  hour(s))  Resp Panel by RT-PCR (Flu A&B, Covid) Nasopharyngeal Swab     Status: None   Collection Time: 05/12/2020  8:27 AM   Specimen: Nasopharyngeal Swab; Nasopharyngeal(NP) swabs in vial transport medium  Result Value Ref Range Status   SARS Coronavirus 2 by RT PCR NEGATIVE NEGATIVE Final    Comment: (NOTE) SARS-CoV-2 target nucleic acids are NOT DETECTED.  The SARS-CoV-2 RNA is generally detectable in upper respiratory specimens during the acute phase of infection. The lowest concentration of SARS-CoV-2 viral copies this assay can detect is 138 copies/mL. A negative result does not preclude SARS-Cov-2 infection and should not be used as the sole basis for treatment or other patient management decisions. A negative result may occur with  improper specimen collection/handling, submission of specimen other than nasopharyngeal swab, presence of viral mutation(s) within the areas targeted by this assay, and inadequate number of viral copies(<138 copies/mL). A negative result must be combined with clinical observations, patient history, and epidemiological information. The expected result is Negative.  Fact Sheet for Patients:  BloggerCourse.com  Fact Sheet for Healthcare Providers:  SeriousBroker.it  This test is no t yet approved or cleared by the [place] FDA and  has been authorized for detection and/or diagnosis of SARS-CoV-2 by FDA under an Emergency Use Authorization (EUA). This EUA will remain  in effect (meaning this test can be used) for the duration of the COVID-19 declaration under Section 564(b)(1) of the Act, 21 U.S.C.section 360bbb-3(b)(1), unless the authorization is terminated  or revoked sooner.       Influenza A by PCR NEGATIVE NEGATIVE Final   Influenza B by PCR NEGATIVE NEGATIVE Final    Comment: (NOTE) The Xpert Xpress SARS-CoV-2/FLU/RSV plus assay is intended as an aid in the diagnosis of influenza from  Nasopharyngeal swab specimens and should not be used as a sole basis for treatment. Nasal washings and aspirates are unacceptable for Xpert Xpress SARS-CoV-2/FLU/RSV testing.  Fact Sheet for Patients: BloggerCourse.com  Fact Sheet  for Healthcare Providers: SeriousBroker.it  This test is not yet approved or cleared by the [place] and has been authorized for detection and/or diagnosis of SARS-CoV-2 by FDA under an Emergency Use Authorization (EUA). This EUA will remain in effect (meaning this test can be used) for the duration of the COVID-19 declaration under Section 564(b)(1) of the Act, 21 U.S.C. section 360bbb-3(b)(1), unless the authorization is terminated or revoked.  Performed at [facility], [address] [place], [place] [address]   MRSA PCR Screening     Status: None   Collection Time: 04/26/2020  6:17 PM   Specimen: Nasopharyngeal  Result Value Ref Range Status   MRSA by PCR NEGATIVE NEGATIVE Final    Comment:        The GeneXpert MRSA Assay (FDA approved for NASAL specimens only), is one component of a comprehensive MRSA colonization surveillance program. It is not intended to diagnose MRSA infection nor to guide or monitor treatment for MRSA infections. Performed at [facility], [address] [place], [place] [address]     Lab Basic Metabolic Panel: Recent Labs  Lab 05/12/20 0425 05/12/20 1733 05/12/20 1859 05/13/20 0309 05/13/20 0616 05/13/20 1602 05/14/20 0502 05/18/20 0448  NA 141  --    < > 136 141 144 142 141  K 2.9*  --    < > 4.5 3.3* 4.6 3.6 4.3  CL 107  --    < > 102 107 112* 111 109  CO2 25  --    < > 25 25 24 23 23   GLUCOSE 88  --    < > 186* 90 104* 112* 105*  BUN 6  --    < > 8 8 9 10 12   CREATININE 0.95  --    < > 1.11 1.04 0.94 0.88 0.89  CALCIUM 8.0*  --    < > 7.7* 7.8* 8.3* 8.2* 8.6*  MG 1.3* 2.2  --  2.1  --  2.4 2.3  --   PHOS 1.1* 2.4*  --  6.7* 3.7  3.3 3.5  --    < > = values in this interval not displayed.   Liver Function Tests: Recent Labs  Lab 05/12/20 0425 05/13/20 0309 05/13/20 0616 05/14/20 0502 18-May-2020 0448  AST 45* 63*  --  81* 89*  ALT 56* 40  --  36 48*  ALKPHOS 57 59  --  75 92  BILITOT 2.0* 1.8*  --  1.6* 1.9*  PROT 5.6* 5.4*  --  6.0* 6.7  ALBUMIN 3.1* 2.7* 2.7* 2.6* 2.5*   No results for input(s): LIPASE, AMYLASE in the last 168 hours. No results for input(s): AMMONIA in the last 168 hours. CBC: Recent Labs  Lab 05/12/20 0425 05-18-20 0448  WBC 8.7 14.2*  HGB 13.9 13.2  HCT 39.5 40.5  MCV 95.0 102.0*  PLT 184 182   Cardiac Enzymes: No results for input(s): CKTOTAL, CKMB, CKMBINDEX, TROPONINI in the last 168 hours. Sepsis Labs: Recent Labs  Lab 05/12/20 0425 2020/05/18 0448  WBC 8.7 14.2*    Procedures/Operations  TTM   Madlynn Lundeen L Desere Gwin 05/18/2020, 4:05 PM

## 2020-05-21 NOTE — Progress Notes (Addendum)
S: Patient's propofol was turned off by CCM at about 7:30 PM  O: BP (!) 141/94   Pulse (!) 124   Temp 98.4 F (36.9 C)   Resp (!) 29   Ht 5\' 11"  (1.803 m)   Wt 115.2 kg   SpO2 97%   BMI 35.42 kg/m   Exam: Intubated. Unresponsive with no movement.   EEG: Some high frequency muscle artifact seen, with underlying flat EEG.   A/R: 55 year old male s/p cardiac arrest, with clinical, EEG and MRI findings most consistent with severe anoxic brain injury.  - Off propofol without recurrence of seizure activity. Also off Versed and fentanyl.  - Continuing LTM.  - Continue Depakote and Keppra  10 minutes of critical care time.   Electronically signed: Dr. 44

## 2020-05-21 DEATH — deceased

## 2020-05-31 NOTE — Telephone Encounter (Signed)
Please advise. Thanks.  

## 2020-06-02 NOTE — Telephone Encounter (Signed)
Called and spoke with patient's sister Christa. I provided her with the information from Dr. Tonia Brooms. I also provided her with the number to medical records to get a copy of his chart. She verbalized understanding.   Nothing further needed at all.

## 2022-09-26 IMAGING — CT CT HEAD W/O CM
3 series · 16 of 47 positions shown, 19 images · non-contrast
Comparison: None.

CLINICAL DATA: Found unresponsive in cardiac arrest.

EXAM:
CT HEAD WITHOUT CONTRAST
TECHNIQUE: Contiguous axial images were obtained from the base of the skull
through the vertex without intravenous contrast.

[Series 3: head 5.0 h30s · axial · 0.50mm/px · z∈[+1108,+1268]mm · 10 of 38 slices shown, 13 images]
[im 3/38  brain]
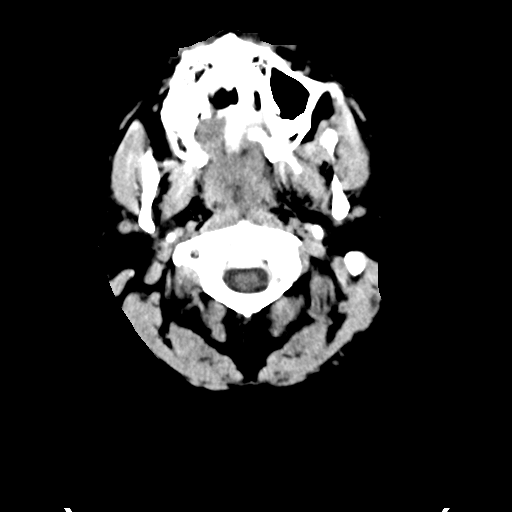
[im 3/38  bone]
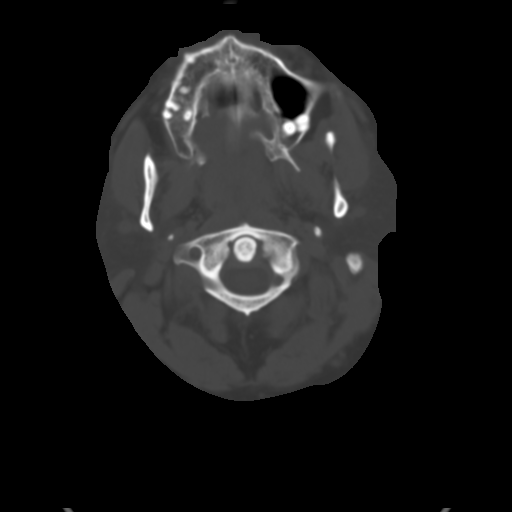
[im 7/38  brain]
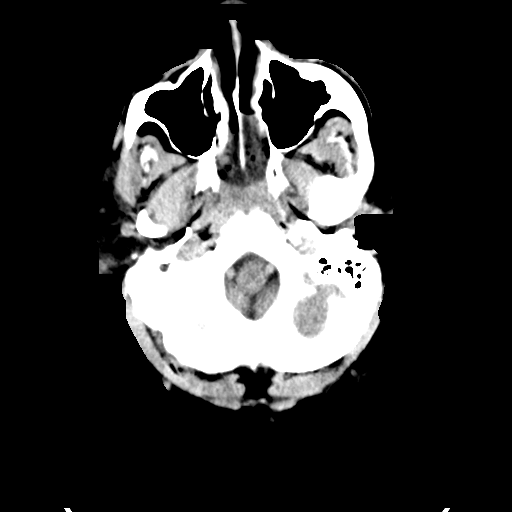
[im 11/38  brain]
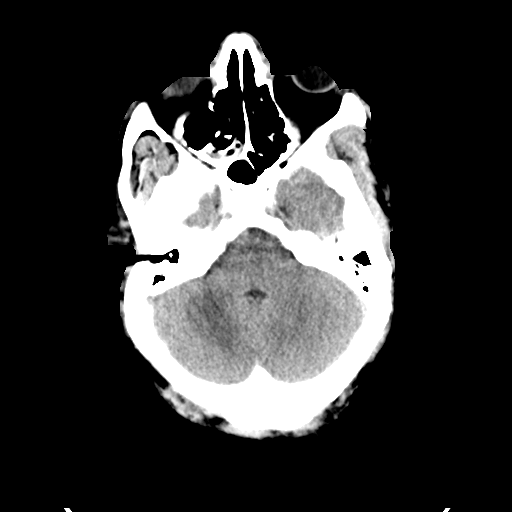
[im 13/38  brain]
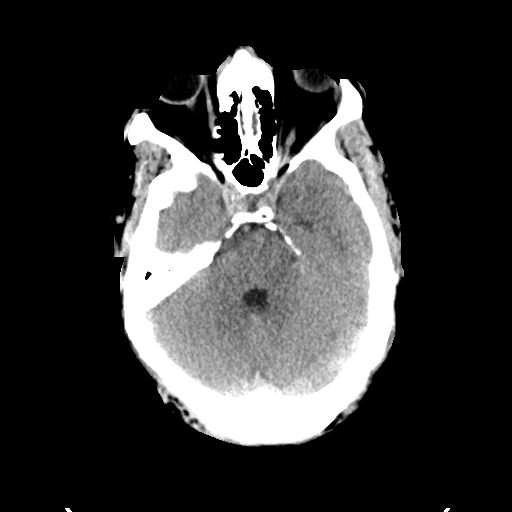
[im 17/38  brain]
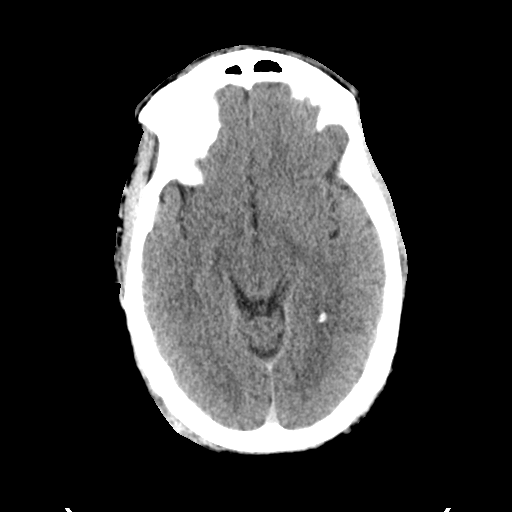
[im 17/38  bone]
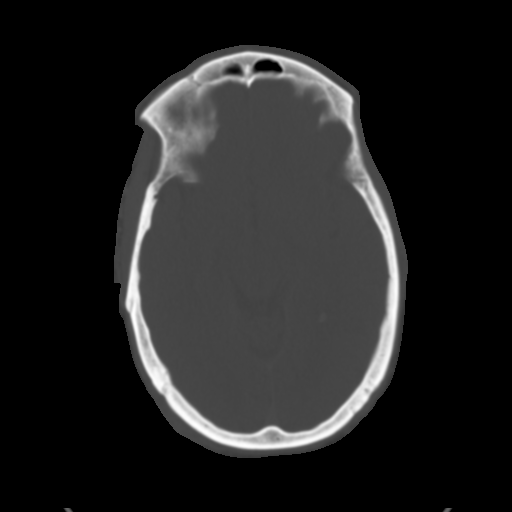
[im 21/38  brain]
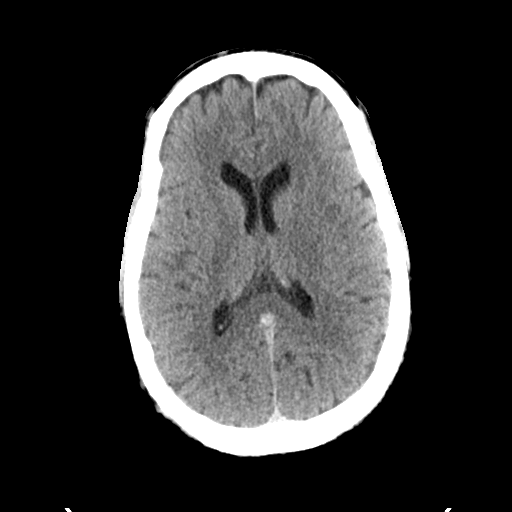
[im 25/38  brain]
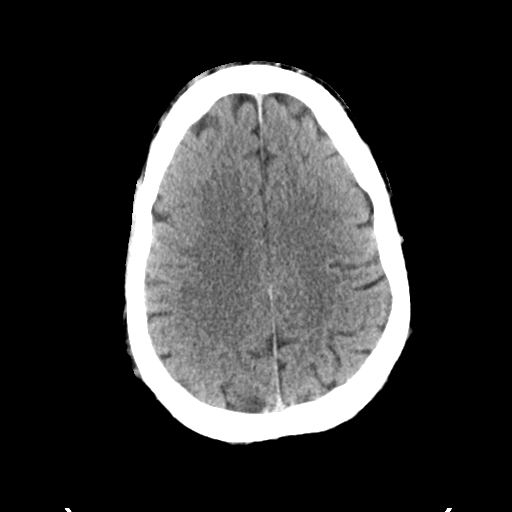
[im 29/38  brain]
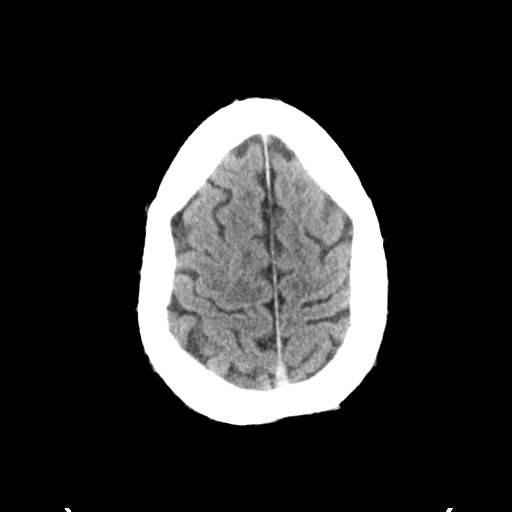
[im 31/38  brain]
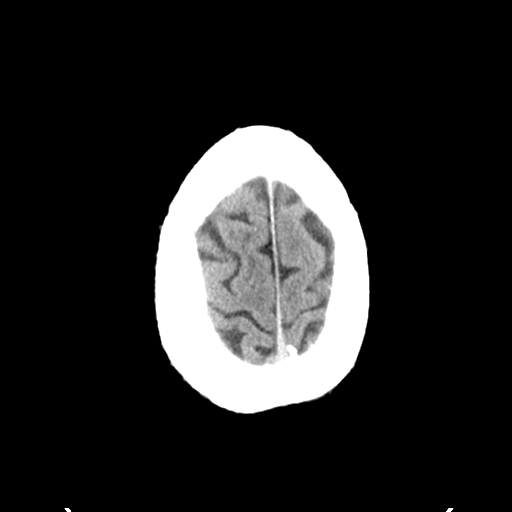
[im 31/38  bone]
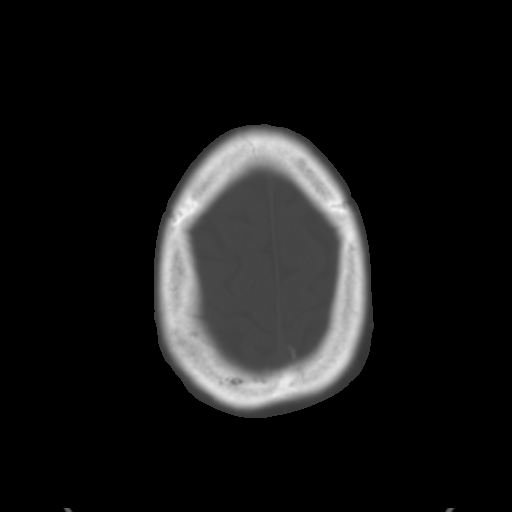
[im 35/38  brain]
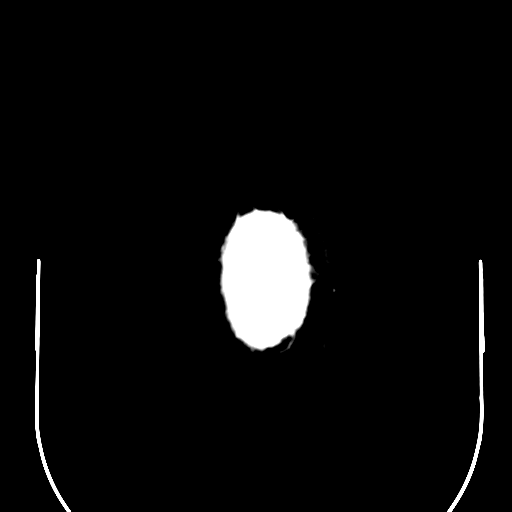

[Series 5: head 3.0 mpr cor · coronal · 0.37mm/px · 3 of 75 slices shown]
[im 25/75  brain]
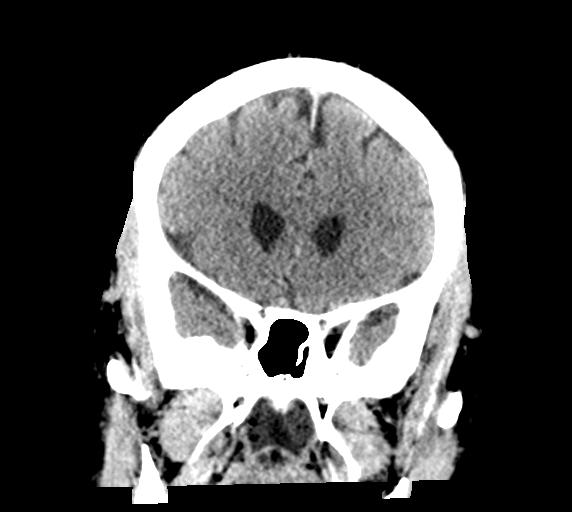
[im 33/75  brain]
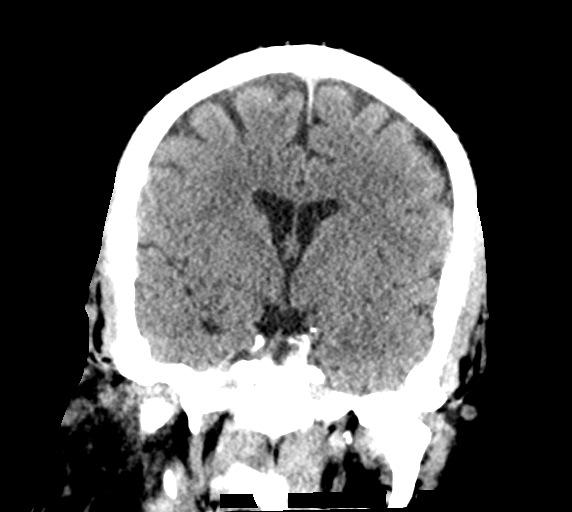
[im 42/75  brain]
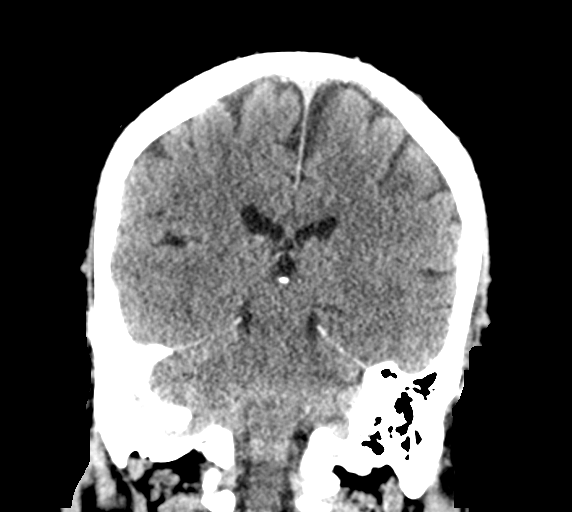

[Series 6: head 3.0 mpr sag · sagittal · 0.37mm/px · 3 of 67 slices shown]
[im 23/67  brain]
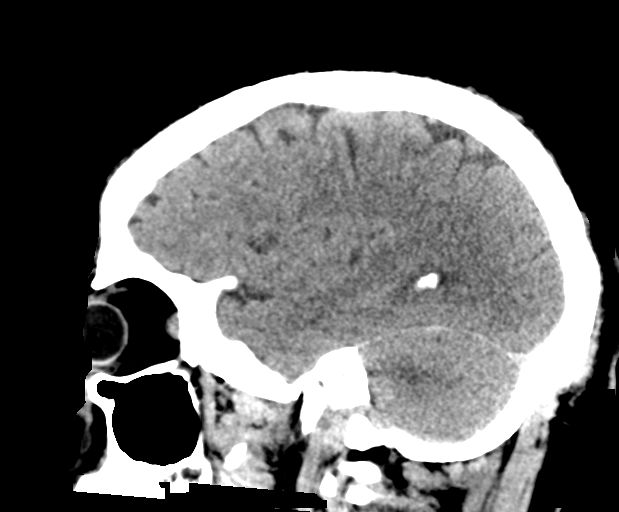
[im 34/67  brain]
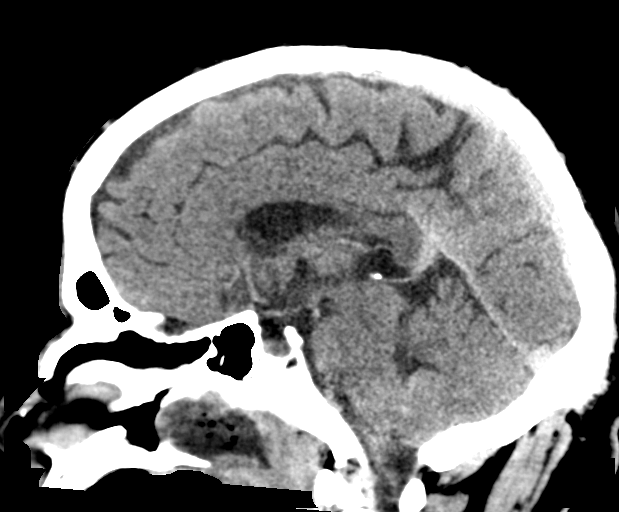
[im 45/67  brain]
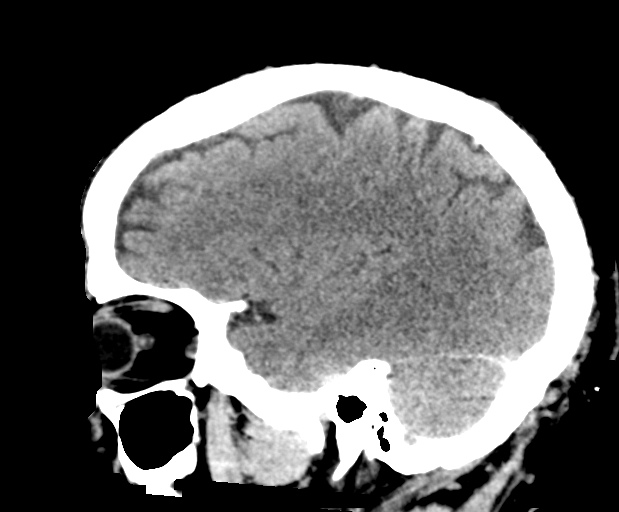

[16 of 47 positions shown; findings below may reference images not displayed]

FINDINGS: Brain: The brain shows a normal appearance without evidence of
malformation, atrophy, old or acute small or large vessel
infarction, mass lesion, hemorrhage, hydrocephalus or extra-axial
collection.

Vascular: No hyperdense vessel. No evidence of atherosclerotic
calcification.

Skull: Normal.  No traumatic finding.  No focal bone lesion.

Sinuses/Orbits: Sinuses are clear. Orbits appear normal. Mastoids
are clear.

Other: None significant
IMPRESSION: Normal head CT.

## 2022-09-30 IMAGING — DX DG CHEST 1V PORT
1 series · 1 of 1 positions shown · non-contrast
Comparison: May 11, 2020.

CLINICAL DATA: Acute respiratory failure with hypoxia.

EXAM:
PORTABLE CHEST 1 VIEW

[chest ap]
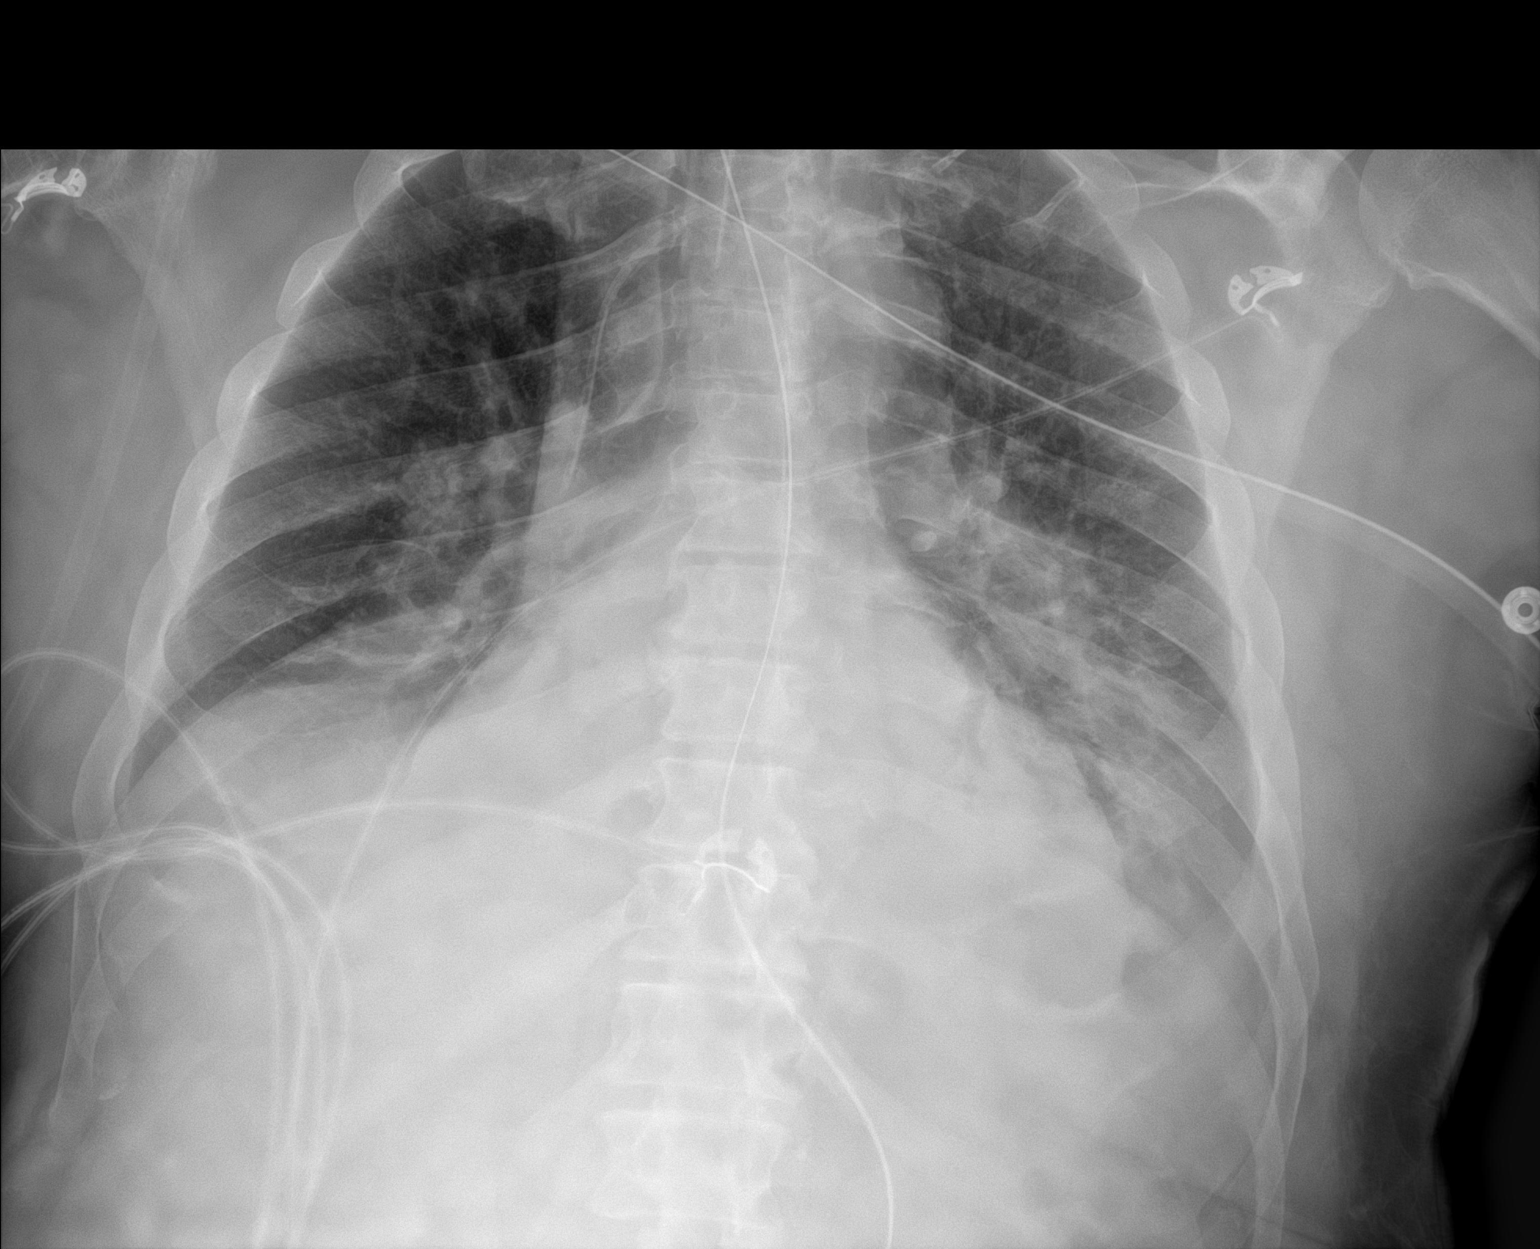

[1 of 1 positions shown; findings below may reference images not displayed]

FINDINGS: Stable cardiomediastinal silhouette. Endotracheal and nasogastric
tubes are unchanged in position. Left internal jugular catheter is
unchanged. No pneumothorax is noted. Mild bibasilar subsegmental
atelectasis or infiltrates are noted, left greater than right. Small
left pleural effusion may be present. Bony thorax is unremarkable.
IMPRESSION: Stable support apparatus. Mild bibasilar subsegmental atelectasis or
infiltrates are noted, left greater than right. Small left pleural
effusion.
